# Patient Record
Sex: Male | Born: 1965 | Race: White | Hispanic: No | Marital: Single | Smoking: Never smoker
Health system: Southern US, Community
[De-identification: ages and names within clinical notes are randomized; demographics above are authoritative.]

## PROBLEM LIST (undated history)

## (undated) DIAGNOSIS — K219 Gastro-esophageal reflux disease without esophagitis: Secondary | ICD-10-CM

## (undated) DIAGNOSIS — G4733 Obstructive sleep apnea (adult) (pediatric): Secondary | ICD-10-CM

## (undated) DIAGNOSIS — E7800 Pure hypercholesterolemia, unspecified: Secondary | ICD-10-CM

## (undated) DIAGNOSIS — T7840XA Allergy, unspecified, initial encounter: Secondary | ICD-10-CM

## (undated) DIAGNOSIS — L309 Dermatitis, unspecified: Secondary | ICD-10-CM

## (undated) HISTORY — DX: Dermatitis, unspecified: L30.9

## (undated) HISTORY — DX: Allergy, unspecified, initial encounter: T78.40XA

## (undated) HISTORY — DX: Gastro-esophageal reflux disease without esophagitis: K21.9

---

## 1998-12-03 ENCOUNTER — Emergency Department (HOSPITAL_COMMUNITY): Admission: EM | Admit: 1998-12-03 | Discharge: 1998-12-03 | Payer: Self-pay | Admitting: Emergency Medicine

## 1998-12-03 ENCOUNTER — Encounter: Payer: Self-pay | Admitting: Emergency Medicine

## 2004-10-30 ENCOUNTER — Ambulatory Visit: Payer: Self-pay | Admitting: Family Medicine

## 2005-10-01 ENCOUNTER — Ambulatory Visit: Payer: Self-pay | Admitting: Family Medicine

## 2007-06-09 ENCOUNTER — Ambulatory Visit: Payer: Self-pay | Admitting: Family Medicine

## 2007-06-09 LAB — CONVERTED CEMR LAB
ALT: 74 units/L — ABNORMAL HIGH (ref 0–53)
AST: 34 units/L (ref 0–37)
Albumin: 4 g/dL (ref 3.5–5.2)
Alkaline Phosphatase: 70 units/L (ref 39–117)
BUN: 21 mg/dL (ref 6–23)
Basophils Absolute: 0 10*3/uL (ref 0.0–0.1)
Basophils Relative: 0.4 % (ref 0.0–1.0)
Bilirubin, Direct: 0.1 mg/dL (ref 0.0–0.3)
CO2: 32 meq/L (ref 19–32)
Calcium: 9.3 mg/dL (ref 8.4–10.5)
Chloride: 110 meq/L (ref 96–112)
Cholesterol: 218 mg/dL (ref 0–200)
Creatinine, Ser: 1 mg/dL (ref 0.4–1.5)
Direct LDL: 132.6 mg/dL
Eosinophils Absolute: 0.2 10*3/uL (ref 0.0–0.6)
Eosinophils Relative: 2 % (ref 0.0–5.0)
GFR calc Af Amer: 106 mL/min
GFR calc non Af Amer: 88 mL/min
Glucose, Bld: 104 mg/dL — ABNORMAL HIGH (ref 70–99)
HCT: 46.8 % (ref 39.0–52.0)
HDL: 31.6 mg/dL — ABNORMAL LOW (ref >39.0)
Hemoglobin: 15.8 g/dL (ref 13.0–17.0)
Lymphocytes Relative: 37.5 % (ref 12.0–46.0)
MCHC: 33.7 g/dL (ref 30.0–36.0)
MCV: 88.5 fL (ref 78.0–100.0)
Monocytes Absolute: 0.7 10*3/uL (ref 0.2–0.7)
Monocytes Relative: 8.2 % (ref 3.0–11.0)
Neutro Abs: 4.4 10*3/uL (ref 1.4–7.7)
Neutrophils Relative %: 51.9 % (ref 43.0–77.0)
Platelets: 257 10*3/uL (ref 150–400)
Potassium: 4.5 meq/L (ref 3.5–5.1)
RBC: 5.29 M/uL (ref 4.22–5.81)
RDW: 12.3 % (ref 11.5–14.6)
Sodium: 144 meq/L (ref 135–145)
TSH: 4.69 microintl units/mL (ref 0.35–5.50)
Total Bilirubin: 0.6 mg/dL (ref 0.3–1.2)
Total CHOL/HDL Ratio: 6.9
Total Protein: 6.8 g/dL (ref 6.0–8.3)
Triglycerides: 366 mg/dL (ref 0–149)
VLDL: 73 mg/dL — ABNORMAL HIGH (ref 0–40)
WBC: 8.5 10*3/uL (ref 4.5–10.5)

## 2007-06-16 ENCOUNTER — Ambulatory Visit: Payer: Self-pay | Admitting: Family Medicine

## 2007-06-16 LAB — CONVERTED CEMR LAB
ALT: 52 units/L (ref 0–53)
AST: 30 units/L (ref 0–37)
Albumin: 4.2 g/dL (ref 3.5–5.2)
Alkaline Phosphatase: 67 units/L (ref 39–117)
BUN: 11 mg/dL (ref 6–23)
Basophils Absolute: 0 10*3/uL (ref 0.0–0.1)
Basophils Relative: 0.4 % (ref 0.0–1.0)
Bilirubin, Direct: 0.1 mg/dL (ref 0.0–0.3)
CO2: 32 meq/L (ref 19–32)
Calcium: 9.3 mg/dL (ref 8.4–10.5)
Chloride: 101 meq/L (ref 96–112)
Cholesterol: 203 mg/dL (ref 0–200)
Creatinine, Ser: 1 mg/dL (ref 0.4–1.5)
Direct LDL: 144.9 mg/dL
Eosinophils Absolute: 0.1 10*3/uL (ref 0.0–0.6)
Eosinophils Relative: 0.6 % (ref 0.0–5.0)
GFR calc Af Amer: 106 mL/min
GFR calc non Af Amer: 88 mL/min
Glucose, Bld: 94 mg/dL (ref 70–99)
HCT: 45.4 % (ref 39.0–52.0)
HDL: 33.6 mg/dL — ABNORMAL LOW (ref >39.0)
Hemoglobin: 15.5 g/dL (ref 13.0–17.0)
Lymphocytes Relative: 19.7 % (ref 12.0–46.0)
MCHC: 34.1 g/dL (ref 30.0–36.0)
MCV: 89.2 fL (ref 78.0–100.0)
Monocytes Absolute: 0.7 10*3/uL (ref 0.2–0.7)
Monocytes Relative: 6.9 % (ref 3.0–11.0)
Neutro Abs: 7.6 10*3/uL (ref 1.4–7.7)
Neutrophils Relative %: 72.4 % (ref 43.0–77.0)
PSA: 0.66 ng/mL (ref 0.10–4.00)
Platelets: 325 10*3/uL (ref 150–400)
Potassium: 4 meq/L (ref 3.5–5.1)
RBC: 5.09 M/uL (ref 4.22–5.81)
RDW: 13.1 % (ref 11.5–14.6)
Sodium: 141 meq/L (ref 135–145)
TSH: 1.83 microintl units/mL (ref 0.35–5.50)
Total Bilirubin: 0.8 mg/dL (ref 0.3–1.2)
Total CHOL/HDL Ratio: 6
Total Protein: 7.4 g/dL (ref 6.0–8.3)
Triglycerides: 180 mg/dL — ABNORMAL HIGH (ref 0–149)
VLDL: 36 mg/dL (ref 0–40)
WBC: 10.4 10*3/uL (ref 4.5–10.5)

## 2008-02-23 ENCOUNTER — Ambulatory Visit: Payer: Self-pay | Admitting: Family Medicine

## 2008-02-23 DIAGNOSIS — F39 Unspecified mood [affective] disorder: Secondary | ICD-10-CM

## 2008-02-23 DIAGNOSIS — J309 Allergic rhinitis, unspecified: Secondary | ICD-10-CM | POA: Insufficient documentation

## 2008-02-23 DIAGNOSIS — L301 Dyshidrosis [pompholyx]: Secondary | ICD-10-CM

## 2008-07-12 ENCOUNTER — Ambulatory Visit: Payer: Self-pay | Admitting: Family Medicine

## 2008-07-12 LAB — CONVERTED CEMR LAB
ALT: 28 units/L (ref 0–53)
AST: 23 units/L (ref 0–37)
Albumin: 4 g/dL (ref 3.5–5.2)
Alkaline Phosphatase: 63 units/L (ref 39–117)
BUN: 17 mg/dL (ref 6–23)
Basophils Absolute: 0 10*3/uL (ref 0.0–0.1)
Basophils Relative: 0.6 % (ref 0.0–3.0)
Bilirubin, Direct: 0.1 mg/dL (ref 0.0–0.3)
CO2: 32 meq/L (ref 19–32)
Calcium: 9.1 mg/dL (ref 8.4–10.5)
Chloride: 106 meq/L (ref 96–112)
Cholesterol: 173 mg/dL (ref 0–200)
Creatinine, Ser: 1.1 mg/dL (ref 0.4–1.5)
Direct LDL: 108.7 mg/dL
Eosinophils Absolute: 0.1 10*3/uL (ref 0.0–0.7)
Eosinophils Relative: 1.3 % (ref 0.0–5.0)
GFR calc Af Amer: 95 mL/min
GFR calc non Af Amer: 78 mL/min
Glucose, Bld: 101 mg/dL — ABNORMAL HIGH (ref 70–99)
HCT: 43.7 % (ref 39.0–52.0)
HDL: 29 mg/dL — ABNORMAL LOW (ref >39.0)
Hemoglobin: 15.6 g/dL (ref 13.0–17.0)
Lymphocytes Relative: 28.2 % (ref 12.0–46.0)
MCHC: 35.7 g/dL (ref 30.0–36.0)
MCV: 89.6 fL (ref 78.0–100.0)
Monocytes Absolute: 0.7 10*3/uL (ref 0.1–1.0)
Monocytes Relative: 8.2 % (ref 3.0–12.0)
Neutro Abs: 4.9 10*3/uL (ref 1.4–7.7)
Neutrophils Relative %: 61.7 % (ref 43.0–77.0)
Platelets: 276 10*3/uL (ref 150–400)
Potassium: 4.3 meq/L (ref 3.5–5.1)
RBC: 4.88 M/uL (ref 4.22–5.81)
RDW: 12.2 % (ref 11.5–14.6)
Sodium: 143 meq/L (ref 135–145)
TSH: 2.36 microintl units/mL (ref 0.35–5.50)
Total Bilirubin: 0.8 mg/dL (ref 0.3–1.2)
Total CHOL/HDL Ratio: 6
Total Protein: 6.8 g/dL (ref 6.0–8.3)
Triglycerides: 206 mg/dL (ref 0–149)
VLDL: 41 mg/dL — ABNORMAL HIGH (ref 0–40)
WBC: 8 10*3/uL (ref 4.5–10.5)

## 2008-07-14 ENCOUNTER — Telehealth: Payer: Self-pay | Admitting: *Deleted

## 2008-07-19 ENCOUNTER — Ambulatory Visit: Payer: Self-pay | Admitting: Family Medicine

## 2008-07-19 DIAGNOSIS — K219 Gastro-esophageal reflux disease without esophagitis: Secondary | ICD-10-CM

## 2011-03-26 ENCOUNTER — Ambulatory Visit (INDEPENDENT_AMBULATORY_CARE_PROVIDER_SITE_OTHER): Payer: BC Managed Care – PPO | Admitting: Family Medicine

## 2011-03-26 ENCOUNTER — Encounter: Payer: Self-pay | Admitting: Family Medicine

## 2011-03-26 VITALS — BP 130/90 | Temp 98.2°F | Ht 73.0 in | Wt 252.0 lb

## 2011-03-26 DIAGNOSIS — J309 Allergic rhinitis, unspecified: Secondary | ICD-10-CM

## 2011-03-26 MED ORDER — PREDNISONE 20 MG PO TABS: ORAL_TABLET | ORAL | Status: DC

## 2011-03-26 NOTE — Patient Instructions (Signed)
Take the prednisone as directed.  Once u  begin tapering the prednisone to every other day, then restart your Zyrtec, 10 mg plain, nightly.  Return p.r.n.

## 2011-03-26 NOTE — Progress Notes (Signed)
  Subjective:    Patient ID: Adrian Powers, male    DOB: Jul 15, 1966, 45 y.o.   MRN: [id number]  Adrian Powers Is a 45 year old, married male, nonsmoker, who comes in today with a 3 week history of allergic rhinitis and two days of pain in his right ear.  He has a history of seasonal allergic rhinitis.  In [place] for about 3 to 4 weeks ago.  Over the weekend developed severe pain in his right ear.  No fever.     Review of Systems    General an infectious and endocrinology, and immunologic review of systems otherwise negative Objective:   Physical Exam    Well-developed well-nourished, male in no acute distress.  Examination HEENT negative.  Neck was supple.  No adenopathy.  Lungs are clear    Assessment & Plan:  Allergic rhinitis.  Plan,,,,,,,,,, prednisone burst and taper followed up by Zyrtec 10 mg nightly

## 2011-07-16 ENCOUNTER — Telehealth: Payer: Self-pay | Admitting: Family Medicine

## 2011-07-16 ENCOUNTER — Ambulatory Visit (INDEPENDENT_AMBULATORY_CARE_PROVIDER_SITE_OTHER): Payer: BC Managed Care – PPO | Admitting: Family Medicine

## 2011-07-16 ENCOUNTER — Encounter: Payer: Self-pay | Admitting: Family Medicine

## 2011-07-16 DIAGNOSIS — J309 Allergic rhinitis, unspecified: Secondary | ICD-10-CM

## 2011-07-16 DIAGNOSIS — R609 Edema, unspecified: Secondary | ICD-10-CM

## 2011-07-16 DIAGNOSIS — Z125 Encounter for screening for malignant neoplasm of prostate: Secondary | ICD-10-CM

## 2011-07-16 MED ORDER — TRIAMTERENE-HCTZ 37.5-25 MG PO TABS: 1.0000 | ORAL_TABLET | Freq: Every day | ORAL | Status: AC

## 2011-07-16 NOTE — Patient Instructions (Signed)
Stay on a complete no salt in diet.  Adequate thiazide 25 mg one tablet daily in the morning.  Elevate her legs as much as possible.  Begin plain Claritin in the morning or plain Zyrtec at bedtime.  ............. allergy symptoms.  Return in two weeks for follow-up

## 2011-07-16 NOTE — Progress Notes (Signed)
  Subjective:    Patient ID: Adrian Powers, male    DOB: December 28, 1965, 45 y.o.   MRN: [id number]  HPI Adrian Powers is a 45 year old, married male, nonsmoker, who comes in today for evaluation of two problems.  The past two weeks.  He says a condition.  Postnasal drip and cough.  He's had a history of allergic rhinitis in the past.  For the past two, months he's had intermittent swelling of his legs, left worse than the right.  His weight is up to 265 pounds.  No chest pain, shortness of breath, PND, or orthopnea   Review of Systems In general, allergic, and cardiovascular review of systems otherwise negative    Objective:   Physical Exam Well-developed overweight male in no acute distress.  Examination of the HEENT were negative, except a lot of postnasal drip, consistent with allergic rhinitis.  Neck was supple.  No adenopathy.  Lungs are clear.  Abdominal exam negative.  Cardiac exam negative.  The legs appear normal.  Left leg is 2+ and the right leg is 1+ edema.  Pulses normal       Assessment & Plan:  Allergic rhinitis.  Plan OTC Claritin or Zyrtec.  Peripheral edema.  Heart size 25 mg q.a.m. Follow-up in two weeks, no salt diet, caloric restriction for weight loss

## 2011-07-16 NOTE — Telephone Encounter (Signed)
Patient will be having cpx labs on 09-03-2011 and would like to have psa (prostate lab). Patient's father has a prostate history and Dr Tawanna Cooler had told him in the past that he will need to be tested. Please order. Thanks.

## 2011-07-30 ENCOUNTER — Ambulatory Visit: Payer: BC Managed Care – PPO | Admitting: Family Medicine

## 2011-09-03 ENCOUNTER — Other Ambulatory Visit: Payer: BC Managed Care – PPO

## 2011-09-10 ENCOUNTER — Encounter: Payer: BC Managed Care – PPO | Admitting: Family Medicine

## 2012-05-19 ENCOUNTER — Emergency Department (HOSPITAL_BASED_OUTPATIENT_CLINIC_OR_DEPARTMENT_OTHER): Payer: BC Managed Care – PPO

## 2012-05-19 ENCOUNTER — Ambulatory Visit (HOSPITAL_BASED_OUTPATIENT_CLINIC_OR_DEPARTMENT_OTHER)
Admission: EM | Admit: 2012-05-19 | Discharge: 2012-05-21 | DRG: 883 | Disposition: A | Discharge status: Home / Self Care | Payer: BC Managed Care – PPO | Attending: Surgery | Admitting: Surgery

## 2012-05-19 ENCOUNTER — Encounter (HOSPITAL_BASED_OUTPATIENT_CLINIC_OR_DEPARTMENT_OTHER): Payer: Self-pay | Admitting: Student

## 2012-05-19 DIAGNOSIS — K37 Unspecified appendicitis: Secondary | ICD-10-CM

## 2012-05-19 DIAGNOSIS — G4733 Obstructive sleep apnea (adult) (pediatric): Secondary | ICD-10-CM | POA: Insufficient documentation

## 2012-05-19 DIAGNOSIS — K3580 Unspecified acute appendicitis: Secondary | ICD-10-CM | POA: Insufficient documentation

## 2012-05-19 DIAGNOSIS — K219 Gastro-esophageal reflux disease without esophagitis: Secondary | ICD-10-CM | POA: Insufficient documentation

## 2012-05-19 DIAGNOSIS — G43909 Migraine, unspecified, not intractable, without status migrainosus: Secondary | ICD-10-CM | POA: Insufficient documentation

## 2012-05-19 DIAGNOSIS — R109 Unspecified abdominal pain: Secondary | ICD-10-CM

## 2012-05-19 DIAGNOSIS — E7800 Pure hypercholesterolemia, unspecified: Secondary | ICD-10-CM | POA: Insufficient documentation

## 2012-05-19 DIAGNOSIS — L259 Unspecified contact dermatitis, unspecified cause: Secondary | ICD-10-CM | POA: Insufficient documentation

## 2012-05-19 HISTORY — DX: Obstructive sleep apnea (adult) (pediatric): G47.33

## 2012-05-19 HISTORY — DX: Pure hypercholesterolemia, unspecified: E78.00

## 2012-05-19 LAB — CBC
MCV: 86.1 fL (ref 78.0–100.0)
Platelets: 273 10*3/uL (ref 150–400)
RDW: 12.9 % (ref 11.5–15.5)
WBC: 14.6 10*3/uL — ABNORMAL HIGH (ref 4.0–10.5)

## 2012-05-19 LAB — URINALYSIS, ROUTINE W REFLEX MICROSCOPIC
Leukocytes, UA: NEGATIVE
Nitrite: NEGATIVE
Protein, ur: NEGATIVE mg/dL
Urobilinogen, UA: 1 mg/dL (ref 0.0–1.0)

## 2012-05-19 LAB — COMPREHENSIVE METABOLIC PANEL
Albumin: 4 g/dL (ref 3.5–5.2)
Alkaline Phosphatase: 83 U/L (ref 39–117)
Creatinine, Ser: 1 mg/dL (ref 0.50–1.35)
GFR calc Af Amer: >90 mL/min (ref >90)
GFR calc non Af Amer: 89 mL/min — ABNORMAL LOW (ref >90)
Sodium: 136 mEq/L (ref 135–145)
Total Bilirubin: 0.6 mg/dL (ref 0.3–1.2)

## 2012-05-19 LAB — DIFFERENTIAL
Basophils Absolute: 0.1 10*3/uL (ref 0.0–0.1)
Eosinophils Absolute: 0.1 10*3/uL (ref 0.0–0.7)
Eosinophils Relative: 1 % (ref 0–5)
Lymphocytes Relative: 18 % (ref 12–46)
Neutrophils Relative %: 69 % (ref 43–77)

## 2012-05-19 MED ORDER — SODIUM CHLORIDE 0.9 % IV BOLUS (SEPSIS)
1000.0000 mL | Freq: Once | INTRAVENOUS | Status: AC
  Administered 2012-05-20: 1000 mL via INTRAVENOUS

## 2012-05-19 MED ORDER — ONDANSETRON HCL 4 MG/2ML IJ SOLN
4.0000 mg | Freq: Once | INTRAMUSCULAR | Status: AC
  Administered 2012-05-19: 4 mg via INTRAVENOUS
  Filled 2012-05-19: qty 2

## 2012-05-19 MED ORDER — MORPHINE SULFATE 2 MG/ML IJ SOLN
INTRAMUSCULAR | Status: AC
  Administered 2012-05-20: 2 mg via INTRAVENOUS
  Filled 2012-05-19: qty 1

## 2012-05-19 MED ORDER — IOHEXOL 300 MG/ML  SOLN
20.0000 mL | INTRAMUSCULAR | Status: AC
  Administered 2012-05-19 – 2012-05-20 (×2): 20 mL via ORAL

## 2012-05-19 MED ORDER — MORPHINE SULFATE 4 MG/ML IJ SOLN
6.0000 mg | Freq: Once | INTRAMUSCULAR | Status: AC
  Administered 2012-05-19: 4 mg via INTRAVENOUS
  Filled 2012-05-19: qty 1

## 2012-05-19 NOTE — ED Provider Notes (Addendum)
History     CSN: [id number]  Arrival date & time 05/19/12  2051   First MD Initiated Contact with Patient 05/19/12 2233      Chief Complaint  Patient presents with  . Abdominal Pain    RLQ     (Consider location/radiation/quality/duration/timing/severity/associated sxs/prior treatment) HPI Comments: Pt with gradually worsening pain to lower abd since yesterday.  Started across both sides of lower abd, no only in RLQ.  Has had some nausea, no vomiting.  +subjective fevers.  No urinary difficulties.  Has had some loose stools today.  No hx of abd surgery.  Patient is a 46 y.o. male presenting with abdominal pain. The history is provided by the patient.  Abdominal Pain The primary symptoms of the illness include abdominal pain, fever and nausea. The primary symptoms of the illness do not include fatigue, shortness of breath, vomiting or diarrhea. The current episode started yesterday. The onset of the illness was gradual. The problem has been gradually worsening.  Symptoms associated with the illness do not include chills, diaphoresis, hematuria, frequency or back pain.    Past Medical History  Diagnosis Date  . Migraine   . Eczema   . Allergy   . GERD (gastroesophageal reflux disease)   . Hypercholesteremia     Past Surgical History  Procedure Date  . Esophagogastroduodenoscopy   . Colonoscopy 03/07/01    Family History  Problem Relation Age of Onset  . Cancer Other     prostate    History  Substance Use Topics  . Smoking status: Never Smoker   . Smokeless tobacco: Not on file  . Alcohol Use: No      Review of Systems  Constitutional: Positive for fever. Negative for chills, diaphoresis and fatigue.  HENT: Negative for congestion, rhinorrhea and sneezing.   Eyes: Negative.   Respiratory: Negative for cough, chest tightness and shortness of breath.   Cardiovascular: Negative for chest pain and leg swelling.  Gastrointestinal: Positive for nausea and abdominal  pain. Negative for vomiting, diarrhea and blood in stool.  Genitourinary: Negative for frequency, hematuria, flank pain, difficulty urinating and testicular pain.  Musculoskeletal: Negative for back pain and arthralgias.  Skin: Negative for rash.  Neurological: Negative for dizziness, speech difficulty, weakness, numbness and headaches.    Allergies  Review of patient's allergies indicates no known allergies.  Home Medications   Current Outpatient Rx  Name Route Sig Dispense Refill  . ATORVASTATIN CALCIUM 40 MG PO TABS Oral Take 40 mg by mouth daily.    . IBUPROFEN 200 MG PO TABS Oral Take 400 mg by mouth every 6 (six) hours as needed. Patient used this medication for pain.    . TRIAMTERENE-HCTZ 37.5-25 MG PO TABS Oral Take 1 tablet by mouth daily. 100 tablet 3    BP 134/77  Pulse 90  Temp 100.1 F (37.8 C) (Oral)  Resp 18  Ht 6\' 1"  (1.854 m)  Wt 247 lb (112.038 kg)  BMI 32.59 kg/m2  SpO2 97%  Physical Exam  Constitutional: He is oriented to person, place, and time. He appears well-developed and well-nourished.  HENT:  Head: Normocephalic and atraumatic.  Eyes: Pupils are equal, round, and reactive to light.  Neck: Normal range of motion. Neck supple.  Cardiovascular: Normal rate, regular rhythm and normal heart sounds.   Pulmonary/Chest: Effort normal and breath sounds normal. No respiratory distress. He has no wheezes. He has no rales. He exhibits no tenderness.  Abdominal: Soft. Bowel sounds are normal. There is  tenderness in the right lower quadrant. There is guarding and tenderness at McBurney's point. There is no rebound and no CVA tenderness.  Musculoskeletal: Normal range of motion. He exhibits no edema.  Lymphadenopathy:    He has no cervical adenopathy.  Neurological: He is alert and oriented to person, place, and time.  Skin: Skin is warm and dry. No rash noted.  Psychiatric: He has a normal mood and affect.    ED Course  Procedures (including critical care  time)  Results for orders placed during the hospital encounter of 05/19/12  URINALYSIS, ROUTINE W REFLEX MICROSCOPIC      Component Value Range   Color, Urine YELLOW  YELLOW   APPearance CLEAR  CLEAR   Specific Gravity, Urine 1.025  1.005 - 1.030   pH 6.0  5.0 - 8.0   Glucose, UA NEGATIVE  NEGATIVE mg/dL   Hgb urine dipstick NEGATIVE  NEGATIVE   Bilirubin Urine NEGATIVE  NEGATIVE   Ketones, ur NEGATIVE  NEGATIVE mg/dL   Protein, ur NEGATIVE  NEGATIVE mg/dL   Urobilinogen, UA 1.0  0.0 - 1.0 mg/dL   Nitrite NEGATIVE  NEGATIVE   Leukocytes, UA NEGATIVE  NEGATIVE  COMPREHENSIVE METABOLIC PANEL      Component Value Range   Sodium 136  135 - 145 mEq/L   Potassium 3.5  3.5 - 5.1 mEq/L   Chloride 96  96 - 112 mEq/L   CO2 31  19 - 32 mEq/L   Glucose, Bld 110 (*) 70 - 99 mg/dL   BUN 17  6 - 23 mg/dL   Creatinine, Ser 1.61  0.50 - 1.35 mg/dL   Calcium 9.5  8.4 - 09.6 mg/dL   Total Protein 7.7  6.0 - 8.3 g/dL   Albumin 4.0  3.5 - 5.2 g/dL   AST 17  0 - 37 U/L   ALT 29  0 - 53 U/L   Alkaline Phosphatase 83  39 - 117 U/L   Total Bilirubin 0.6  0.3 - 1.2 mg/dL   GFR calc non Af Amer 89 (*) >90 mL/min   GFR calc Af Amer >90  >90 mL/min  CBC      Component Value Range   WBC 14.6 (*) 4.0 - 10.5 K/uL   RBC 4.96  4.22 - 5.81 MIL/uL   Hemoglobin 15.3  13.0 - 17.0 g/dL   HCT 04.5  40.9 - 81.1 %   MCV 86.1  78.0 - 100.0 fL   MCH 30.8  26.0 - 34.0 pg   MCHC 35.8  30.0 - 36.0 g/dL   RDW 91.4  78.2 - 95.6 %   Platelets 273  150 - 400 K/uL  DIFFERENTIAL      Component Value Range   Neutrophils Relative 69  43 - 77 %   Neutro Abs 10.1 (*) 1.7 - 7.7 K/uL   Lymphocytes Relative 18  12 - 46 %   Lymphs Abs 2.7  0.7 - 4.0 K/uL   Monocytes Relative 12  3 - 12 %   Monocytes Absolute 1.7 (*) 0.1 - 1.0 K/uL   Eosinophils Relative 1  0 - 5 %   Eosinophils Absolute 0.1  0.0 - 0.7 K/uL   Basophils Relative 0  0 - 1 %   Basophils Absolute 0.1  0.0 - 0.1 K/uL   No results found.  Date: 05/20/2012   Rate: 73  Rhythm: sinus rhythm   QRS Axis: normal  Intervals: normal  ST/T Wave abnormalities:normal  Conduction Disutrbances:none  Narrative Interpretation:   Old EKG Reviewed: similar to prior EKG     1. Abdominal pain       MDM  Pt awaiting CT abd/pelvis.  Concern for appendicitis.  Given pain meds, IVFs.  Will turn over to Dr. Nicanor Alcon pending CT scan        Rolan Bucco, MD 05/19/12 1610  Rolan Bucco, MD 08/30/12 [phone]

## 2012-05-19 NOTE — ED Notes (Signed)
Pt in with c/o lower abd pain with onset yesterday across entire lower abd and reports feeling need to defecate with onset but no relief. Reports pain only in lower r quad at present time. Denies N V D CP LOC at present time but reports N with onset yesterday. Denies urinary difficulties.

## 2012-05-20 ENCOUNTER — Encounter (HOSPITAL_COMMUNITY): Payer: Self-pay | Admitting: Anesthesiology

## 2012-05-20 ENCOUNTER — Emergency Department (HOSPITAL_BASED_OUTPATIENT_CLINIC_OR_DEPARTMENT_OTHER): Payer: BC Managed Care – PPO

## 2012-05-20 ENCOUNTER — Encounter (HOSPITAL_COMMUNITY): Payer: Self-pay | Admitting: *Deleted

## 2012-05-20 ENCOUNTER — Emergency Department (HOSPITAL_COMMUNITY): Payer: BC Managed Care – PPO | Admitting: Anesthesiology

## 2012-05-20 ENCOUNTER — Encounter (HOSPITAL_COMMUNITY): Payer: Self-pay | Admitting: General Surgery

## 2012-05-20 ENCOUNTER — Encounter (HOSPITAL_COMMUNITY)
Admission: EM | Disposition: A | Discharge status: Home / Self Care | Payer: Self-pay | Source: Home / Self Care | Attending: Emergency Medicine

## 2012-05-20 DIAGNOSIS — K3580 Unspecified acute appendicitis: Secondary | ICD-10-CM

## 2012-05-20 HISTORY — PX: LAPAROSCOPIC APPENDECTOMY: SHX408

## 2012-05-20 SURGERY — APPENDECTOMY, LAPAROSCOPIC
Anesthesia: General | Site: Abdomen | Wound class: Contaminated

## 2012-05-20 MED ORDER — GLYCOPYRROLATE 0.2 MG/ML IJ SOLN
INTRAMUSCULAR | Status: DC | PRN
  Administered 2012-05-20: .6 mg via INTRAVENOUS
  Administered 2012-05-20: .2 mg via INTRAVENOUS

## 2012-05-20 MED ORDER — ONDANSETRON HCL 4 MG PO TABS: 4.0000 mg | ORAL_TABLET | Freq: Four times a day (QID) | ORAL | Status: DC | PRN

## 2012-05-20 MED ORDER — LIDOCAINE HCL (CARDIAC) 20 MG/ML IV SOLN
INTRAVENOUS | Status: DC | PRN
  Administered 2012-05-20: 100 mg via INTRAVENOUS

## 2012-05-20 MED ORDER — ONDANSETRON HCL 4 MG/2ML IJ SOLN
INTRAMUSCULAR | Status: AC
  Filled 2012-05-20: qty 2

## 2012-05-20 MED ORDER — BUPIVACAINE HCL (PF) 0.5 % IJ SOLN
INTRAMUSCULAR | Status: AC
  Filled 2012-05-20: qty 30

## 2012-05-20 MED ORDER — LACTATED RINGERS IV SOLN
INTRAVENOUS | Status: DC | PRN
  Administered 2012-05-20 (×2): via INTRAVENOUS

## 2012-05-20 MED ORDER — KETOROLAC TROMETHAMINE 30 MG/ML IJ SOLN
INTRAMUSCULAR | Status: AC
  Filled 2012-05-20: qty 1

## 2012-05-20 MED ORDER — ACETAMINOPHEN 10 MG/ML IV SOLN
INTRAVENOUS | Status: AC
  Filled 2012-05-20: qty 100

## 2012-05-20 MED ORDER — HYDROMORPHONE HCL PF 1 MG/ML IJ SOLN: 0.2500 mg | INTRAMUSCULAR | Status: DC | PRN

## 2012-05-20 MED ORDER — OXYCODONE-ACETAMINOPHEN 5-325 MG PO TABS
1.0000 | ORAL_TABLET | ORAL | Status: DC | PRN
  Administered 2012-05-20 (×2): 2 via ORAL
  Administered 2012-05-21: 1 via ORAL
  Administered 2012-05-21: 2 via ORAL
  Administered 2012-05-21: 1 via ORAL
  Filled 2012-05-20 (×3): qty 2
  Filled 2012-05-20 (×2): qty 1

## 2012-05-20 MED ORDER — SODIUM CHLORIDE 0.9 % IV SOLN
3.0000 g | Freq: Four times a day (QID) | INTRAVENOUS | Status: DC
  Administered 2012-05-20 – 2012-05-21 (×6): 3 g via INTRAVENOUS
  Filled 2012-05-20 (×8): qty 3

## 2012-05-20 MED ORDER — SODIUM CHLORIDE 0.9 % IV SOLN
3.0000 g | Freq: Once | INTRAVENOUS | Status: AC
  Administered 2012-05-20: 3 g via INTRAVENOUS
  Filled 2012-05-20: qty 3

## 2012-05-20 MED ORDER — MORPHINE SULFATE 2 MG/ML IJ SOLN
2.0000 mg | Freq: Once | INTRAMUSCULAR | Status: AC
  Administered 2012-05-20: 2 mg via INTRAVENOUS

## 2012-05-20 MED ORDER — DEXAMETHASONE SODIUM PHOSPHATE 4 MG/ML IJ SOLN
INTRAMUSCULAR | Status: DC | PRN
  Administered 2012-05-20: 10 mg via INTRAVENOUS

## 2012-05-20 MED ORDER — KETOROLAC TROMETHAMINE 15 MG/ML IJ SOLN
INTRAMUSCULAR | Status: AC
  Filled 2012-05-20: qty 1

## 2012-05-20 MED ORDER — NEOSTIGMINE METHYLSULFATE 1 MG/ML IJ SOLN
INTRAMUSCULAR | Status: DC | PRN
  Administered 2012-05-20: 4 mg via INTRAVENOUS

## 2012-05-20 MED ORDER — LACTATED RINGERS IR SOLN
Status: DC | PRN
  Administered 2012-05-20: 1000 mL

## 2012-05-20 MED ORDER — FENTANYL CITRATE 0.05 MG/ML IJ SOLN
INTRAMUSCULAR | Status: DC | PRN
  Administered 2012-05-20 (×2): 50 ug via INTRAVENOUS
  Administered 2012-05-20: 100 ug via INTRAVENOUS

## 2012-05-20 MED ORDER — MORPHINE SULFATE 2 MG/ML IJ SOLN
INTRAMUSCULAR | Status: AC
  Administered 2012-05-20: 2 mg via INTRAVENOUS
  Filled 2012-05-20: qty 1

## 2012-05-20 MED ORDER — PROMETHAZINE HCL 25 MG/ML IJ SOLN
6.2500 mg | INTRAMUSCULAR | Status: DC | PRN
  Administered 2012-05-20: 12.5 mg via INTRAVENOUS

## 2012-05-20 MED ORDER — PROPOFOL 10 MG/ML IV EMUL
INTRAVENOUS | Status: DC | PRN
  Administered 2012-05-20: 200 mg via INTRAVENOUS

## 2012-05-20 MED ORDER — SUCCINYLCHOLINE CHLORIDE 20 MG/ML IJ SOLN
INTRAMUSCULAR | Status: DC | PRN
  Administered 2012-05-20: 100 mg via INTRAVENOUS

## 2012-05-20 MED ORDER — KETOROLAC TROMETHAMINE 30 MG/ML IJ SOLN
15.0000 mg | Freq: Once | INTRAMUSCULAR | Status: AC | PRN
  Administered 2012-05-20: 30 mg via INTRAVENOUS

## 2012-05-20 MED ORDER — CISATRACURIUM BESYLATE (PF) 10 MG/5ML IV SOLN
INTRAVENOUS | Status: DC | PRN
  Administered 2012-05-20: 6 mg via INTRAVENOUS
  Administered 2012-05-20: 2 mg via INTRAVENOUS

## 2012-05-20 MED ORDER — ACETAMINOPHEN 10 MG/ML IV SOLN
INTRAVENOUS | Status: DC | PRN
  Administered 2012-05-20: 1000 mg via INTRAVENOUS

## 2012-05-20 MED ORDER — ONDANSETRON HCL 4 MG/2ML IJ SOLN: 4.0000 mg | Freq: Four times a day (QID) | INTRAMUSCULAR | Status: DC | PRN

## 2012-05-20 MED ORDER — PROMETHAZINE HCL 25 MG/ML IJ SOLN
INTRAMUSCULAR | Status: AC
  Filled 2012-05-20: qty 1

## 2012-05-20 MED ORDER — MORPHINE SULFATE 2 MG/ML IJ SOLN: 2.0000 mg | INTRAMUSCULAR | Status: DC | PRN

## 2012-05-20 MED ORDER — BUPIVACAINE HCL (PF) 0.5 % IJ SOLN
INTRAMUSCULAR | Status: DC | PRN
  Administered 2012-05-20: 14 mL

## 2012-05-20 MED ORDER — BUPIVACAINE-EPINEPHRINE (PF) 0.5% -1:200000 IJ SOLN
INTRAMUSCULAR | Status: AC
  Filled 2012-05-20: qty 10

## 2012-05-20 MED ORDER — KCL IN DEXTROSE-NACL 20-5-0.9 MEQ/L-%-% IV SOLN
INTRAVENOUS | Status: DC
  Administered 2012-05-20 – 2012-05-21 (×2): via INTRAVENOUS
  Filled 2012-05-20 (×5): qty 1000

## 2012-05-20 MED ORDER — ONDANSETRON HCL 4 MG/2ML IJ SOLN
INTRAMUSCULAR | Status: DC | PRN
  Administered 2012-05-20: 4 mg via INTRAVENOUS

## 2012-05-20 MED ORDER — IOHEXOL 300 MG/ML  SOLN
100.0000 mL | Freq: Once | INTRAMUSCULAR | Status: AC | PRN
  Administered 2012-05-20: 100 mL via INTRAVENOUS

## 2012-05-20 SURGICAL SUPPLY — 46 items
APL SKNCLS STERI-STRIP NONHPOA (GAUZE/BANDAGES/DRESSINGS) ×1
APPLIER CLIP 5 13 M/L LIGAMAX5 (MISCELLANEOUS) ×2
APPLIER CLIP ROT 10 11.4 M/L (STAPLE)
APR CLP MED LRG 11.4X10 (STAPLE)
APR CLP MED LRG 5 ANG JAW (MISCELLANEOUS) ×1
BAG SPEC RTRVL LRG 6X4 10 (ENDOMECHANICALS)
BENZOIN TINCTURE PRP APPL 2/3 (GAUZE/BANDAGES/DRESSINGS) ×2 IMPLANT
CANISTER SUCTION 2500CC (MISCELLANEOUS) ×2 IMPLANT
CHLORAPREP W/TINT 26ML (MISCELLANEOUS) ×2 IMPLANT
CLIP APPLIE 5 13 M/L LIGAMAX5 (MISCELLANEOUS) IMPLANT
CLIP APPLIE ROT 10 11.4 M/L (STAPLE) IMPLANT
CLOTH BEACON ORANGE TIMEOUT ST (SAFETY) ×2 IMPLANT
CLSR STERI-STRIP ANTIMIC 1/2X4 (GAUZE/BANDAGES/DRESSINGS) ×1 IMPLANT
CUTTER FLEX LINEAR 45M (STAPLE) ×1 IMPLANT
DECANTER SPIKE VIAL GLASS SM (MISCELLANEOUS) ×1 IMPLANT
DRAPE LAPAROSCOPIC ABDOMINAL (DRAPES) ×2 IMPLANT
DRAPE UTILITY XL STRL (DRAPES) ×2 IMPLANT
DRSG TEGADERM 2-3/8X2-3/4 SM (GAUZE/BANDAGES/DRESSINGS) ×4 IMPLANT
ELECT REM PT RETURN 9FT ADLT (ELECTROSURGICAL) ×2
ELECTRODE REM PT RTRN 9FT ADLT (ELECTROSURGICAL) ×1 IMPLANT
ENDOLOOP SUT PDS II  0 18 (SUTURE)
ENDOLOOP SUT PDS II 0 18 (SUTURE) IMPLANT
GLOVE BIOGEL PI IND STRL 7.0 (GLOVE) ×1 IMPLANT
GLOVE BIOGEL PI INDICATOR 7.0 (GLOVE) ×1
GLOVE ECLIPSE 8.0 STRL XLNG CF (GLOVE) ×2 IMPLANT
GLOVE INDICATOR 8.0 STRL GRN (GLOVE) ×4 IMPLANT
GOWN STRL NON-REIN LRG LVL3 (GOWN DISPOSABLE) ×2 IMPLANT
GOWN STRL REIN XL XLG (GOWN DISPOSABLE) ×4 IMPLANT
KIT BASIN OR (CUSTOM PROCEDURE TRAY) ×2 IMPLANT
PENCIL BUTTON HOLSTER BLD 10FT (ELECTRODE) ×1 IMPLANT
POUCH SPECIMEN RETRIEVAL 10MM (ENDOMECHANICALS) IMPLANT
RELOAD 45 VASCULAR/THIN (ENDOMECHANICALS) IMPLANT
RELOAD STAPLE 45 2.5 WHT GRN (ENDOMECHANICALS) IMPLANT
RELOAD STAPLE 45 3.5 BLU ETS (ENDOMECHANICALS) IMPLANT
RELOAD STAPLE TA45 3.5 REG BLU (ENDOMECHANICALS) ×2 IMPLANT
SCALPEL HARMONIC ACE (MISCELLANEOUS) IMPLANT
SET IRRIG TUBING LAPAROSCOPIC (IRRIGATION / IRRIGATOR) ×2 IMPLANT
SOLUTION ANTI FOG 6CC (MISCELLANEOUS) ×2 IMPLANT
STRIP CLOSURE SKIN 1/2X4 (GAUZE/BANDAGES/DRESSINGS) ×2 IMPLANT
SUT MNCRL AB 4-0 PS2 18 (SUTURE) ×2 IMPLANT
TOWEL OR 17X26 10 PK STRL BLUE (TOWEL DISPOSABLE) ×2 IMPLANT
TRAY FOLEY CATH 14FRSI W/METER (CATHETERS) ×2 IMPLANT
TRAY LAP CHOLE (CUSTOM PROCEDURE TRAY) ×2 IMPLANT
TROCAR BLADELESS OPT 5 75 (ENDOMECHANICALS) ×4 IMPLANT
TROCAR XCEL BLUNT TIP 100MML (ENDOMECHANICALS) IMPLANT
TUBING INSUFFLATION 10FT LAP (TUBING) ×2 IMPLANT

## 2012-05-20 NOTE — ED Notes (Signed)
Report given to [facility] for transport 

## 2012-05-20 NOTE — Anesthesia Postprocedure Evaluation (Signed)
  Anesthesia Post-op Note  Patient: Adrian Powers  Procedure(s) Performed: Procedure(s) (LRB): APPENDECTOMY LAPAROSCOPIC (N/A)  Patient Location: PACU  Anesthesia Type: General  Level of Consciousness: awake and alert   Airway and Oxygen Therapy: Patient Spontanous Breathing  Post-op Pain: mild  Post-op Assessment: Post-op Vital signs reviewed, Patient's Cardiovascular Status Stable, Respiratory Function Stable, Patent Airway and No signs of Nausea or vomiting  Post-op Vital Signs: stable  Complications: No apparent anesthesia complications

## 2012-05-20 NOTE — Progress Notes (Signed)
2lpm by o2 added to pt cpap unit

## 2012-05-20 NOTE — Progress Notes (Signed)
Pt placed at default settings for unk home settings.

## 2012-05-20 NOTE — Progress Notes (Signed)
Day of Surgery  Subjective: OK so far after surgery.  Up to BR once so far.  Objective: Vital signs in last 24 hours: Temp:  [97.4 F (36.3 C)-100.1 F (37.8 C)] 97.5 F (36.4 C) (06/18 1045) Pulse Rate:  [71-90] 74  (06/18 1045) Resp:  [10-18] 12  (06/18 1045) BP: (107-134)/(64-87) 107/69 mmHg (06/18 1045) SpO2:  [94 %-100 %] 94 % (06/18 1045) Weight:  [112.038 kg (247 lb)-112.401 kg (247 lb 12.8 oz)] 112.401 kg (247 lb 12.8 oz) (06/18 1145) Last BM Date: 05/19/12  Intake/Output from previous day: 06/17 0701 - 06/18 0700 In: 1300 [I.V.:1300] Out: 300 [Urine:200; Blood:100] Intake/Output this shift: Total I/O In: 1630.8 [P.O.:360; I.V.:1070.8; IV Piggyback:200] Out: 1100 [Urine:1100]  General appearance: alert, cooperative and no distress GI: tender, and sore.  Lab Results:   [facility] 05/19/12 2120  WBC 14.6*  HGB 15.3  HCT 42.7  PLT 273    BMET  Basename 05/19/12 2120  NA 136  K 3.5  CL 96  CO2 31  GLUCOSE 110*  BUN 17  CREATININE 1.00  CALCIUM 9.5   PT/INR No results found for this basename: LABPROT:2,INR:2 in the last 72 hours   Lab 05/19/12 2120  AST 17  ALT 29  ALKPHOS 83  BILITOT 0.6  PROT 7.7  ALBUMIN 4.0     Lipase  No results found for this basename: lipase     Studies/Results: Dg Chest 2 View  05/20/2012  *RADIOLOGY REPORT*  Clinical Data: Right lower quadrant abdominal pain.  Transfer admission chest radiograph.  CHEST - 2 VIEW  Comparison: None.  Findings: The lungs are well-aerated and clear.  There is no evidence of focal opacification, pleural effusion or pneumothorax.  The heart is normal in size; the mediastinal contour is within normal limits.  No acute osseous abnormalities are seen.  IMPRESSION: No acute cardiopulmonary process seen.  Original Report Authenticated By: Tonia Ghent, M.D.   Ct Abdomen Pelvis W Contrast  05/20/2012  *RADIOLOGY REPORT*  Clinical Data: Right lower quadrant pain.  Leukocytosis.  CT ABDOMEN AND  PELVIS WITH CONTRAST  Technique:  Multidetector CT imaging of the abdomen and pelvis was performed following the standard protocol during bolus administration of intravenous contrast.  Contrast: OMNIPAQUE IOHEXOL 300 MG/ML  SOLN  Comparison: None.  Findings: The abdominal parenchymal organs are normal in appearance.  Gallbladder is unremarkable.  No evidence of hydronephrosis.  No soft tissue masses or lymphadenopathy identified within the abdomen or pelvis.  The appendix is diffusely thickened and there is moderate periappendiceal inflammatory changes.  This is consistent with acute appendicitis.  No evidence of abscess or free fluid.  No evidence of bowel obstruction.  IMPRESSION:  1.  Positive for acute appendicitis. 2.  No evidence of abscess or other complication.  Original Report Authenticated By: Danae Orleans, M.D.    Medications:    . ampicillin-sulbactam (UNASYN) IV  3 g Intravenous Once  . ampicillin-sulbactam (UNASYN) IV  3 g Intravenous Q6H  . iohexol  20 mL Oral Q1 Hr x 2  . ketorolac      . ketorolac      .  morphine injection  2 mg Intravenous Once  . morphine      .  morphine injection  6 mg Intravenous Once  . ondansetron  4 mg Intravenous Once  . promethazine      . sodium chloride  1,000 mL Intravenous Once    Assessment/Plan Acute appendicitis without mention of peritonitis .  Migraine    .  Eczema    .  Allergy    .  GERD (gastroesophageal reflux disease)    .  Hypercholesteremia    .  OSA (obstructive sleep apnea)       Plan:  Advance to fulls as tolerated, mobilize.  IS, Will see in AM.     LOS: 1 day    Adrian Powers 05/20/2012

## 2012-05-20 NOTE — Op Note (Signed)
Appendectomy, Lap, Procedure Note  Indications: Mr. Levinson presented with a history of right-sided abdominal pain. His evaluation  revealed findings consistent with acute appendicitis.  He is now brought to the operating room for appendectomy.  Pre-operative Diagnosis: Acute appendicitis without mention of peritonitis  Post-operative Diagnosis: Same  Surgeon:  Avel Peace, M.D.  Anesthesia:  General   Anesthesia: General endotracheal anesthesia  ASA Class: 2  Procedure Details  He was seen again in the Holding Room.  The patient was taken to Operating Room, identified as DAMARRI RAMPY and the procedure verified as Appendectomy. A Time Out was held and the above information confirmed.  The patient was placed in the supine position and general anesthesia was induced, along with placement of orogastric tube, SCDs, and a Foley catheter.  The hair on the abdominal wall was clipped. The abdomen was prepped and draped in a sterile fashion. Dilute Marcaine solution was infiltrated at all trocar sites for local anesthetic effect.  A small infraumbilical incision was made through the skin, subcutaneous tissue, fascia, and peritoneum entering the peritoneal cavity under direct vision.  A pursestring suture was passed around the incision with a 0 Vicryl.  The Hasson was introduced into the peritoneal cavity and the tails of the suture were used to hold the Hasson in place.   The pneumoperitoneum was then established to steady pressure of 15 mmHg.  Additional 5 mm cannulas then placed in the left lower quadrant of the abdomen and the right upper quadrant region under direct visualization. A careful evaluation of the entire abdomen was carried out. The patient was placed in Trendelenburg and left lateral decubitus position. The small intestines were retracted in the cephalad and left lateral direction away from the pelvis and right lower quadrant. The patient was found to have an enlarged and  inflamed appendix that was in the right lower quadrant. Significant inflammatory changes were noted. There were inflammatory adhesions between the appendix and part of the sigmoid colon and terminal ileum.  There was no evidence of perforation or abscess.  The appendix was carefully mobilized by separating the appendix from the inflammatory adhesions to the sigmoid colon and terminal ileum bluntly.. The mesoappendix was  divided with the harmonic scalpel down to the base of the appendix.   The appendix was amputated off the cecum, with a small cuff of cecum, using an endo-GIA stapler.  There was a small amount of bleeding from the staple line which was controlled with hemoclips, but there was no evidence of leakage. Copious irrigation was  performed and irrigant fluid suctioned from the abdomen as much as possible.  A 4 quadrant inspection was performed and there is no evidence of bleeding or organ injury.  The umbilical trocar was removed and the  port site fascia was closed via the purse string suture under laparoscopic vision. There was no residual palpable fascial defect.  The remaining trocars were removed and all  trocar site skin wounds were closed with 4-0 Monocryl subcuticular stitches. Steri-Strips and sterile dressings were applied.  Instrument, sponge, and needle counts were correct at the conclusion of the case.   Findings: The appendix was found to be inflamed. There were not signs of necrosis.  There was not perforation. There was not abscess formation.  Estimated Blood Loss:  less than 100 mL         Drains: None                 Specimens: Appendix  Complications:  None; patient tolerated the procedure well.         Disposition: PACU - hemodynamically stable.         Condition: stable

## 2012-05-20 NOTE — ED Notes (Signed)
Report given to charge RN Diane at [facility]

## 2012-05-20 NOTE — Transfer of Care (Signed)
Immediate Anesthesia Transfer of Care Note  Patient: Adrian Powers  Procedure(s) Performed: Procedure(s) (LRB): APPENDECTOMY LAPAROSCOPIC (N/A)  Patient Location: PACU  Anesthesia Type: General  Level of Consciousness: sedated  Airway & Oxygen Therapy: Patient Spontanous Breathing and Patient connected to face mask oxygen  Post-op Assessment: Report given to PACU RN and Post -op Vital signs reviewed and stable  Post vital signs: Reviewed and stable  Complications: No apparent anesthesia complications

## 2012-05-20 NOTE — Anesthesia Preprocedure Evaluation (Addendum)
Anesthesia Evaluation  Patient identified by MRN, date of birth, ID band Patient awake    Reviewed: Allergy & Precautions, H&P , NPO status , Patient's Chart, lab work & pertinent test results  Airway Mallampati: II TM Distance: <3 FB Neck ROM: Full    Dental No notable dental hx.    Pulmonary sleep apnea ,  breath sounds clear to auscultation  Pulmonary exam normal       Cardiovascular negative cardio ROS  Rhythm:Regular Rate:Normal     Neuro/Psych negative neurological ROS  negative psych ROS   GI/Hepatic negative GI ROS, Neg liver ROS,   Endo/Other  negative endocrine ROS  Renal/GU negative Renal ROS  negative genitourinary   Musculoskeletal negative musculoskeletal ROS (+)   Abdominal   Peds negative pediatric ROS (+)  Hematology negative hematology ROS (+)   Anesthesia Other Findings   Reproductive/Obstetrics negative OB ROS                           Anesthesia Physical Anesthesia Plan  ASA: II and Emergent  Anesthesia Plan: General   Post-op Pain Management:    Induction: Intravenous and Rapid sequence  Airway Management Planned: Oral ETT  Additional Equipment:   Intra-op Plan:   Post-operative Plan: Extubation in OR  Informed Consent: I have reviewed the patients History and Physical, chart, labs and discussed the procedure including the risks, benefits and alternatives for the proposed anesthesia with the patient or authorized representative who has indicated his/her understanding and acceptance.   Dental advisory given  Plan Discussed with: CRNA  Anesthesia Plan Comments:         Anesthesia Quick Evaluation

## 2012-05-20 NOTE — H&P (Signed)
Adrian Powers is an 46 y.o. male.   Chief Complaint: Acute appendicitis HPI: He was transferred from the Medical [facility] in [facility] because of acute appendicitis. Yesterday at noon he developed some sharp lower abdominal pain that persisted. He had subjective fever and chills as well as nausea. The pain eventually radiated to the right lower quadrant. He was evaluated at the Medical Center and found to have acute appendicitis by clinical exam and CT scan. He is here now for appendectomy.  Past Medical History  Diagnosis Date  . Migraine   . Eczema   . Allergy   . GERD (gastroesophageal reflux disease)   . Hypercholesteremia   . OSA (obstructive sleep apnea)     Past Surgical History  Procedure Date  . Esophagogastroduodenoscopy   . Colonoscopy 03/07/01    Family History  Problem Relation Age of Onset  . Cancer Other     prostate   Social History:  reports that he has never smoked. He does not have any smokeless tobacco history on file. He reports that he does not drink alcohol or use illicit drugs.  Allergies: No Known Allergies  Prior to Admission medications   Medication Sig Start Date End Date Taking? Authorizing Provider  atorvastatin (LIPITOR) 40 MG tablet Take 40 mg by mouth daily.   Yes Historical Provider, MD  ibuprofen (ADVIL,MOTRIN) 200 MG tablet Take 400 mg by mouth every 6 (six) hours as needed. Patient used this medication for pain.   Yes Historical Provider, MD  triamterene-hydrochlorothiazide (MAXZIDE-25) 37.5-25 MG per tablet Take 1 tablet by mouth daily. 07/16/11 07/15/12 Yes Roderick Pee, MD     (Not in a hospital admission)  Results for orders placed during the hospital encounter of 05/19/12 (from the past 48 hour(s))  COMPREHENSIVE METABOLIC PANEL     Status: Abnormal   Collection Time   05/19/12  9:20 PM      Component Value Range Comment   Sodium 136  135 - 145 mEq/L    Potassium 3.5  3.5 - 5.1 mEq/L    Chloride 96  96 - 112 mEq/L    CO2 31  19  - 32 mEq/L    Glucose, Bld 110 (*) 70 - 99 mg/dL    BUN 17  6 - 23 mg/dL    Creatinine, Ser 1.61  0.50 - 1.35 mg/dL    Calcium 9.5  8.4 - 09.6 mg/dL    Total Protein 7.7  6.0 - 8.3 g/dL    Albumin 4.0  3.5 - 5.2 g/dL    AST 17  0 - 37 U/L    ALT 29  0 - 53 U/L    Alkaline Phosphatase 83  39 - 117 U/L    Total Bilirubin 0.6  0.3 - 1.2 mg/dL    GFR calc non Af Amer 89 (*) >90 mL/min    GFR calc Af Amer >90  >90 mL/min   CBC     Status: Abnormal   Collection Time   05/19/12  9:20 PM      Component Value Range Comment   WBC 14.6 (*) 4.0 - 10.5 K/uL    RBC 4.96  4.22 - 5.81 MIL/uL    Hemoglobin 15.3  13.0 - 17.0 g/dL    HCT 04.5  40.9 - 81.1 %    MCV 86.1  78.0 - 100.0 fL    MCH 30.8  26.0 - 34.0 pg    MCHC 35.8  30.0 - 36.0 g/dL  RDW 12.9  11.5 - 15.5 %    Platelets 273  150 - 400 K/uL   DIFFERENTIAL     Status: Abnormal   Collection Time   05/19/12  9:20 PM      Component Value Range Comment   Neutrophils Relative 69  43 - 77 %    Neutro Abs 10.1 (*) 1.7 - 7.7 K/uL    Lymphocytes Relative 18  12 - 46 %    Lymphs Abs 2.7  0.7 - 4.0 K/uL    Monocytes Relative 12  3 - 12 %    Monocytes Absolute 1.7 (*) 0.1 - 1.0 K/uL    Eosinophils Relative 1  0 - 5 %    Eosinophils Absolute 0.1  0.0 - 0.7 K/uL    Basophils Relative 0  0 - 1 %    Basophils Absolute 0.1  0.0 - 0.1 K/uL   URINALYSIS, ROUTINE W REFLEX MICROSCOPIC     Status: Normal   Collection Time   05/19/12 10:07 PM      Component Value Range Comment   Color, Urine YELLOW  YELLOW    APPearance CLEAR  CLEAR    Specific Gravity, Urine 1.025  1.005 - 1.030    pH 6.0  5.0 - 8.0    Glucose, UA NEGATIVE  NEGATIVE mg/dL    Hgb urine dipstick NEGATIVE  NEGATIVE    Bilirubin Urine NEGATIVE  NEGATIVE    Ketones, ur NEGATIVE  NEGATIVE mg/dL    Protein, ur NEGATIVE  NEGATIVE mg/dL    Urobilinogen, UA 1.0  0.0 - 1.0 mg/dL    Nitrite NEGATIVE  NEGATIVE    Leukocytes, UA NEGATIVE  NEGATIVE MICROSCOPIC NOT DONE ON URINES WITH NEGATIVE  PROTEIN, BLOOD, LEUKOCYTES, NITRITE, OR GLUCOSE <1000 mg/dL.   Dg Chest 2 View  05/20/2012  *RADIOLOGY REPORT*  Clinical Data: Right lower quadrant abdominal pain.  Transfer admission chest radiograph.  CHEST - 2 VIEW  Comparison: None.  Findings: The lungs are well-aerated and clear.  There is no evidence of focal opacification, pleural effusion or pneumothorax.  The heart is normal in size; the mediastinal contour is within normal limits.  No acute osseous abnormalities are seen.  IMPRESSION: No acute cardiopulmonary process seen.  Original Report Authenticated By: Tonia Ghent, M.D.   Ct Abdomen Pelvis W Contrast  05/20/2012  *RADIOLOGY REPORT*  Clinical Data: Right lower quadrant pain.  Leukocytosis.  CT ABDOMEN AND PELVIS WITH CONTRAST  Technique:  Multidetector CT imaging of the abdomen and pelvis was performed following the standard protocol during bolus administration of intravenous contrast.  Contrast: OMNIPAQUE IOHEXOL 300 MG/ML  SOLN  Comparison: None.  Findings: The abdominal parenchymal organs are normal in appearance.  Gallbladder is unremarkable.  No evidence of hydronephrosis.  No soft tissue masses or lymphadenopathy identified within the abdomen or pelvis.  The appendix is diffusely thickened and there is moderate periappendiceal inflammatory changes.  This is consistent with acute appendicitis.  No evidence of abscess or free fluid.  No evidence of bowel obstruction.  IMPRESSION:  1.  Positive for acute appendicitis. 2.  No evidence of abscess or other complication.  Original Report Authenticated By: Danae Orleans, M.D.    Review of Systems  Constitutional: Positive for fever and chills.  Respiratory: Negative for shortness of breath.        He has sleep apnea but does not use a CPAP machine.  Cardiovascular: Negative for chest pain.       He  describes a history of tachycardia that was evaluated by a cardiologist but no heart disease was detected.  Gastrointestinal: Positive  for nausea, abdominal pain and diarrhea.  Genitourinary: Negative for dysuria and hematuria.  Neurological: Positive for headaches. Negative for seizures.  Endo/Heme/Allergies: Does not bruise/bleed easily.    Blood pressure 108/66, pulse 76, temperature 98.5 F (36.9 C), temperature source Oral, resp. rate 18, height 6\' 1"  (1.854 m), weight 247 lb (112.038 kg), SpO2 97.00%. Physical Exam  Constitutional:       Overweight male in no acute distress.  HENT:  Head: Normocephalic and atraumatic.  Eyes: EOM are normal. No scleral icterus.  Neck: Neck supple.  Cardiovascular: Normal rate and regular rhythm.   Respiratory: Effort normal and breath sounds normal.  GI: Soft. He exhibits no mass. There is tenderness (In the right lower quadrant to palpation and percussion). There is guarding.  Musculoskeletal: He exhibits no edema.  Lymphadenopathy:    He has no cervical adenopathy.  Skin: Skin is warm and dry.  Psychiatric: He has a normal mood and affect. His behavior is normal.     Assessment/Plan Acute appendicitis.  Plan: Laparoscopic possible open appendectomy. He may need a CPAP machine postoperatively. He has received IV antibiotics.  I have discussed the procedure and risks of appendectomy. The risks include but are not limited to bleeding, infection, wound problems, anesthesia, injury to intra-abdominal organs, he seems to understand and agrees with the plan.  Tomeika Weinmann J 05/20/2012, 3:52 AM

## 2012-05-20 NOTE — Anesthesia Procedure Notes (Signed)
Procedure Name: Intubation Date/Time: 05/20/2012 4:44 AM Performed by: Leroy Libman L Patient Re-evaluated:Patient Re-evaluated prior to inductionOxygen Delivery Method: Circle system utilized Preoxygenation: Pre-oxygenation with 100% oxygen Intubation Type: IV induction, Cricoid Pressure applied and Rapid sequence Laryngoscope Size: Miller and 3 Grade View: Grade II Tube type: Oral Tube size: 8.0 mm Number of attempts: 1 Airway Equipment and Method: Stylet Placement Confirmation: ETT inserted through vocal cords under direct vision,  breath sounds checked- equal and bilateral and positive ETCO2 Secured at: 22 cm Tube secured with: Tape Dental Injury: Teeth and Oropharynx as per pre-operative assessment

## 2012-05-20 NOTE — ED Notes (Signed)
WUJ:[id number]<JY> Expected date:<BR> Expected time:<BR> Means of arrival:<BR> Comments:<BR> Transfer from [facility]

## 2012-05-20 NOTE — Preoperative (Signed)
Beta Blockers   Reason not to administer Beta Blockers:Not Applicable 

## 2012-05-21 MED ORDER — ACETAMINOPHEN 325 MG PO TABS: 650.0000 mg | ORAL_TABLET | Freq: Four times a day (QID) | ORAL | Status: AC | PRN

## 2012-05-21 MED ORDER — OXYCODONE-ACETAMINOPHEN 5-325 MG PO TABS: 1.0000 | ORAL_TABLET | ORAL | Status: AC | PRN

## 2012-05-21 MED ORDER — SODIUM CHLORIDE 0.9 % IJ SOLN: 3.0000 mL | INTRAMUSCULAR | Status: DC | PRN

## 2012-05-21 NOTE — Progress Notes (Signed)
Pt for d/c home today. IV d/c'd. Steristrips CDI to 3 abdominal incisions. No noted drainage nor s/sx of infection to abdominal surgical incision. Pt able to tolerate regular diet w/o N/V. D/C instructions & RX given with verbalized understanding. No changes in am assessments today.

## 2012-05-21 NOTE — Progress Notes (Signed)
1 Day Post-Op  Subjective: Had a burger last pm for supper.  Just had a pain med.  Thinks he's doing well. Some flatus last PM.  Objective: Vital signs in last 24 hours: Temp:  [97.4 F (36.3 C)-98.5 F (36.9 C)] 97.8 F (36.6 C) (06/19 0600) Pulse Rate:  [63-80] 63  (06/19 0600) Resp:  [12-14] 14  (06/18 2144) BP: (100-122)/(63-73) 100/63 mmHg (06/19 0600) SpO2:  [94 %-98 %] 98 % (06/19 0600) Weight:  [112.401 kg (247 lb 12.8 oz)] 112.401 kg (247 lb 12.8 oz) (06/18 1145) Last BM Date: 05/19/12 840 po yesterday, VSS, no labs  Intake/Output from previous day: 06/18 0701 - 06/19 0700 In: 3692.1 [P.O.:840; I.V.:2352.1; IV Piggyback:500] Out: 2225 [Urine:2225] Intake/Output this shift:    General appearance: alert, cooperative and no distress Resp: clear to auscultation bilaterally GI: soft, few bowel sounds, incisions ok.  not distended.  Lab Results:   [facility] 05/19/12 2120  WBC 14.6*  HGB 15.3  HCT 42.7  PLT 273    BMET  Basename 05/19/12 2120  NA 136  K 3.5  CL 96  CO2 31  GLUCOSE 110*  BUN 17  CREATININE 1.00  CALCIUM 9.5   PT/INR No results found for this basename: LABPROT:2,INR:2 in the last 72 hours   Lab 05/19/12 2120  AST 17  ALT 29  ALKPHOS 83  BILITOT 0.6  PROT 7.7  ALBUMIN 4.0     Lipase  No results found for this basename: lipase     Studies/Results: Dg Chest 2 View  05/20/2012  *RADIOLOGY REPORT*  Clinical Data: Right lower quadrant abdominal pain.  Transfer admission chest radiograph.  CHEST - 2 VIEW  Comparison: None.  Findings: The lungs are well-aerated and clear.  There is no evidence of focal opacification, pleural effusion or pneumothorax.  The heart is normal in size; the mediastinal contour is within normal limits.  No acute osseous abnormalities are seen.  IMPRESSION: No acute cardiopulmonary process seen.  Original Report Authenticated By: Tonia Ghent, M.D.   Ct Abdomen Pelvis W Contrast  05/20/2012  *RADIOLOGY REPORT*   Clinical Data: Right lower quadrant pain.  Leukocytosis.  CT ABDOMEN AND PELVIS WITH CONTRAST  Technique:  Multidetector CT imaging of the abdomen and pelvis was performed following the standard protocol during bolus administration of intravenous contrast.  Contrast: OMNIPAQUE IOHEXOL 300 MG/ML  SOLN  Comparison: None.  Findings: The abdominal parenchymal organs are normal in appearance.  Gallbladder is unremarkable.  No evidence of hydronephrosis.  No soft tissue masses or lymphadenopathy identified within the abdomen or pelvis.  The appendix is diffusely thickened and there is moderate periappendiceal inflammatory changes.  This is consistent with acute appendicitis.  No evidence of abscess or free fluid.  No evidence of bowel obstruction.  IMPRESSION:  1.  Positive for acute appendicitis. 2.  No evidence of abscess or other complication.  Original Report Authenticated By: Danae Orleans, M.D.    Medications:    . ampicillin-sulbactam (UNASYN) IV  3 g Intravenous Q6H  . ketorolac      . ketorolac      . promethazine        Assessment/Plan Acute appendicitis without mention of peritonitis  .  Migraine    .  Eczema    .  Allergy    .  GERD (gastroesophageal reflux disease)    .  Hypercholesteremia    .  OSA (obstructive sleep apnea)     Plan:  Mobilize more, see  how he does this AM, and if OK may let him go home later today.       LOS: 2 days    Fannie Alomar 05/21/2012

## 2012-05-21 NOTE — Discharge Summary (Signed)
Physician Discharge Summary  Patient ID: Adrian Powers MRN: [id number] DOB/AGE: [birth year] 45 y.o.  Admit date: 05/19/2012 Discharge date: 05/21/2012  Admission Diagnoses: Acute Appendicitis Patient Active Problem List  Diagnosis  . MOOD SWINGS  . ALLERGIC RHINITIS  . GERD  . DYSHIDROSIS  . Peripheral edema   Discharge Diagnoses: Acute appendicitis without mention of peritonitis  .  Migraine    .  Eczema    .  Allergy    .  GERD (gastroesophageal reflux disease)    .  Hypercholesteremia    .  OSA (obstructive sleep apnea)      Active Problems:  * No active hospital problems. *    PROCEDURES: Laparoscopic Appendectomy 05/20/12 Dr. Huntley Dec Course: He was transferred from the Medical [facility] in [facility] because of acute appendicitis. Yesterday at noon he developed some sharp lower abdominal pain that persisted. He had subjective fever and chills as well as nausea. The pain eventually radiated to the right lower quadrant. He was evaluated at the Medical Center and found to have acute appendicitis by clinical exam and CT scan. He is here now for appendectomy. He underwent surgery by Dr. Abbey Chatters. And finished in early AM.  He has had his diet advanced and he was mobilized He's made good progress.  We plan to send him home after lunch today. Follow up in 2 weeks with DR. Rosenbower.    Disposition: Final discharge disposition not confirmed   Medication List  As of 05/21/2012  1:41 PM   TAKE these medications         acetaminophen 325 MG tablet   Commonly known as: TYLENOL   Take 2 tablets (650 mg total) by mouth every 6 (six) hours as needed for pain.      atorvastatin 40 MG tablet   Commonly known as: LIPITOR   Take 40 mg by mouth daily.      ibuprofen 200 MG tablet   Commonly known as: ADVIL,MOTRIN   Take 400 mg by mouth every 6 (six) hours as needed. Patient used this medication for pain.      oxyCODONE-acetaminophen 5-325 MG per tablet   Commonly known as: PERCOCET   Take 1-2 tablets by mouth every 4 (four) hours as needed.      triamterene-hydrochlorothiazide 37.5-25 MG per tablet   Commonly known as: MAXZIDE-25   Take 1 tablet by mouth daily.             SignedSherrie George 05/21/2012, 1:41 PM

## 2012-05-21 NOTE — Discharge Instructions (Signed)
Laparoscopic Appendectomy Appendectomy is surgery to remove the appendix. Laparoscopic surgery uses several small cuts (incisions) instead of one large incision. Laparoscopic surgery offers a shorter recovery time and less discomfort. LET YOUR CAREGIVER KNOW ABOUT:  Allergies to food or medicine.   Medicines taken, including vitamins, dietary supplements, herbs, eyedrops, over-the-counter medicines, and creams.   Use of steroids (by mouth or creams).   Previous problems with anesthetics or numbing medicines.   History of bleeding problems or blood clots.   Previous surgery.   Other health problems, including diabetes, heart problems, lung problems, and kidney problems.   Possibility of pregnancy, if this applies.  RISKS AND COMPLICATIONS  Infection. A germ starts growing in the wound. This can usually be treated with antibiotics. In some cases, the wound will need to be opened and cleaned.   Bleeding.   Damage to other organs.   Sores (abscesses).   Chronic pain at the incision sites. This is defined as pain that lasts for more than 3 months.   Blood clots in the legs that may rarely travel to the lungs.   Infection in the lungs (pneumonia).  BEFORE THE PROCEDURE Appendectomy is usually performed immediately after an inflamed appendix (appendicitis) is diagnosed. No preparation is necessary ahead of this procedure. PROCEDURE  You will be given medicine that makes you sleep (general anesthetic). After you are asleep, a flexible tube (catheter) may be inserted into your bladder to drain your urine during surgery. The tube is removed before you wake up after surgery. When you are asleep, carbondioxide gas will be used to inflate your abdomen. This will allow your surgeon to see inside your abdomen and perform your surgery. Three small incisions will be made in your abdomen. Your surgeon will insert a thin, lighted tube (laparoscope) through one of the incisions. Your surgeon will  look through the laparoscope while performing the surgery. Other tools will be inserted through the other incisions. Laparoscopic procedures may not be appropriate when:  There is major scarring from a previous surgery.   The patient has bleeding disorders.   A pregnancy is near term.   There are other conditions which make the laparoscopic procedure impossible, such as an advanced infection or a ruptured appendix.  If your surgeon feels it is not safe to continue with the laparoscopic procedure, he or she will perform an open surgery instead. This gives the surgeon a larger view and more space to work. Open surgery requires a longer recovery time. After your appendix is removed, your incisions will be closed with stitches (sutures) or skin adhesive. AFTER THE PROCEDURE You will be taken to a recovery room. When the anesthesia has worn off, you will be returned to your hospital room. You will be given pain medicines to keep you comfortable. Ask your caregiver how long your hospital stay will be. Document Released: 07/03/2004 Document Revised: 11/08/2011 Document Reviewed: 05/29/2011 [facility] Patient Information 2012 [place], [place].CCS ______[facility] LAPAROSCOPIC SURGERY: POST OP INSTRUCTIONS Always review your discharge instruction sheet given to you by the facility where your surgery was performed. IF YOU HAVE DISABILITY OR FAMILY LEAVE FORMS, YOU MUST BRING THEM TO THE OFFICE FOR PROCESSING.   DO NOT GIVE THEM TO YOUR DOCTOR.  1. A prescription for pain medication may be given to you upon discharge.  Take your pain medication as prescribed, if needed.  If narcotic pain medicine is not needed, then you may take acetaminophen (Tylenol) or ibuprofen (Advil) as needed. 2. Take your  usually prescribed medications unless otherwise directed. 3. If you need a refill on your pain medication, please contact your pharmacy.  They will contact our office to request authorization.  Prescriptions will not be filled after 5pm or on week-ends. 4. You should follow a light diet the first few days after arrival home, such as soup and crackers, etc.  Be sure to include lots of fluids daily. 5. Most patients will experience some swelling and bruising in the area of the incisions.  Ice packs will help.  Swelling and bruising can take several days to resolve.  6. It is common to experience some constipation if taking pain medication after surgery.  Increasing fluid intake and taking a stool softener (such as Colace) will usually help or prevent this problem from occurring.  A mild laxative (Milk of Magnesia or Miralax) should be taken according to package instructions if there are no bowel movements after 48 hours. 7. Unless discharge instructions indicate otherwise, you may remove your bandages 24-48 hours after surgery, and you may shower at that time.  You may have steri-strips (small skin tapes) in place directly over the incision.  These strips should be left on the skin for 7-10 days.  If your surgeon used skin glue on the incision, you may shower in 24 hours.  The glue will flake off over the next 2-3 weeks.  Any sutures or staples will be removed at the office during your follow-up visit. 8. ACTIVITIES:  You may resume regular (light) daily activities beginning the next day--such as daily self-care, walking, climbing stairs--gradually increasing activities as tolerated.  You may have sexual intercourse when it is comfortable.  Refrain from any heavy lifting or straining until approved by your doctor. a. You may drive when you are no longer taking prescription pain medication, you can comfortably wear a seatbelt, and you can safely maneuver your car and apply brakes. b. RETURN TO WORK:  __________________________________________________________ 9. You should see your doctor in the office for a follow-up appointment approximately 2-3 weeks after your surgery.  Make sure that you call for  this appointment within a day or two after you arrive home to insure a convenient appointment time. 10. OTHER INSTRUCTIONS: __________________________________________________________________________________________________________________________ __________________________________________________________________________________________________________________________ WHEN TO CALL YOUR DOCTOR: 1. Fever over 101.0 2. Inability to urinate 3. Continued bleeding from incision. 4. Increased pain, redness, or drainage from the incision. 5. Increasing abdominal pain  The clinic staff is available to answer your questions during regular business hours.  Please don't hesitate to call and ask to speak to one of the nurses for clinical concerns.  If you have a medical emergency, go to the nearest emergency room or call 911.  A surgeon from [facility] is always on call at the hospital. [address], [place], [place]  [address] ? [address] [address], [place], [place]   [address] [phone] ? [phone] ? FAX [phone] Web site: [email or web address]

## 2012-05-21 NOTE — Progress Notes (Signed)
Patient complaining of tenderness in his R fingers and forearm, r/t IV. Fluids currently running at 125cc/hr.Site shows no signs of infiltration or phlebitis.  On call MD paged, Dr. Jarrett Soho with order to NSL IV. Will continue to monitor patient.

## 2012-05-22 ENCOUNTER — Encounter (HOSPITAL_COMMUNITY): Payer: Self-pay | Admitting: General Surgery

## 2012-05-26 NOTE — Progress Notes (Signed)
He left before being seen by me

## 2012-06-04 ENCOUNTER — Telehealth (INDEPENDENT_AMBULATORY_CARE_PROVIDER_SITE_OTHER): Payer: Self-pay

## 2012-06-04 NOTE — Telephone Encounter (Signed)
LMOV pt's appt on 06/23/12 with Dr. Abbey Chatters.

## 2012-06-23 ENCOUNTER — Ambulatory Visit (INDEPENDENT_AMBULATORY_CARE_PROVIDER_SITE_OTHER): Payer: BC Managed Care – PPO | Admitting: General Surgery

## 2012-06-23 ENCOUNTER — Encounter (INDEPENDENT_AMBULATORY_CARE_PROVIDER_SITE_OTHER): Payer: Self-pay | Admitting: General Surgery

## 2012-06-23 VITALS — BP 130/80 | HR 72 | Temp 97.4°F | Resp 18 | Ht 73.0 in | Wt 250.8 lb

## 2012-06-23 DIAGNOSIS — Z9889 Other specified postprocedural states: Secondary | ICD-10-CM

## 2012-06-23 NOTE — Patient Instructions (Signed)
Diet and activities as tolerated. 

## 2012-06-23 NOTE — Progress Notes (Signed)
Procedure:  Laparoscopic appendectomy  Date:  05/20/2012  Pathology:  Acute appendicitis  History:  He is here for his first postoperative visit and is doing well. He has no complaints. 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  Exam: Abdomen is soft, incisions are clean, dry, and intact.  Assessment:  Doing well postoperatively.  Plan:  Resume all normal activities including full duty work. Return visit as needed.

## 2012-10-25 IMAGING — CT CT ABD-PELV W/ CM
2 of 5 series · 16 of 46 positions shown, 18 images · IV contrast (APPLIED)
Comparison: None.

CLINICAL DATA: Right lower quadrant pain.  Leukocytosis.

CT ABDOMEN AND PELVIS WITH CONTRAST
TECHNIQUE: Multidetector CT imaging of the abdomen and pelvis was
performed following the standard protocol during bolus
administration of intravenous contrast.
Contrast: 100mL OMNIPAQUE IOHEXOL 300 MG/ML  SOLN

[Series 2: abd/pelvis 5.0 b31f · axial · 0.82mm/px · z∈[-524,-104]mm · 13 of 94 slices shown, 15 images]
[im 5/94  soft-tissue]
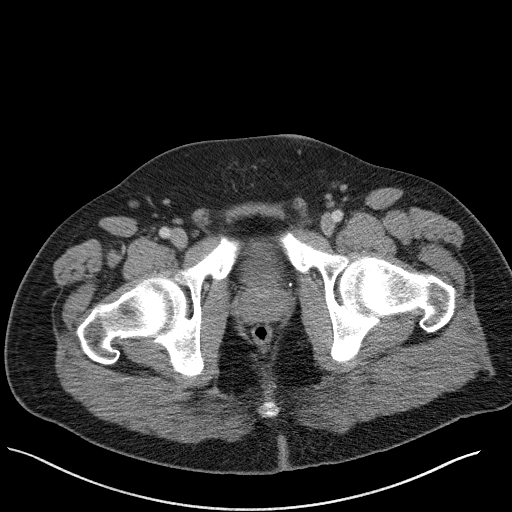
[im 5/94  bone]
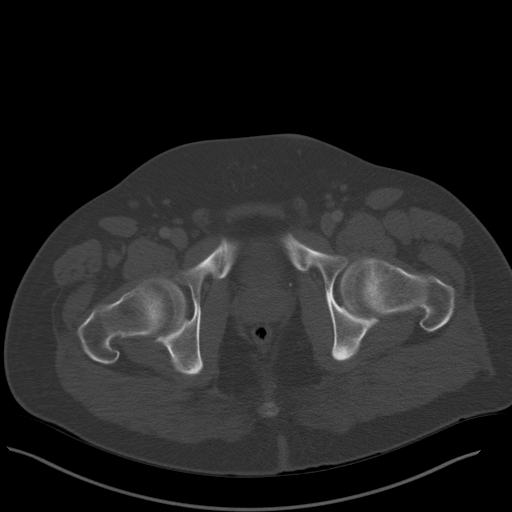
[im 14/94  soft-tissue]
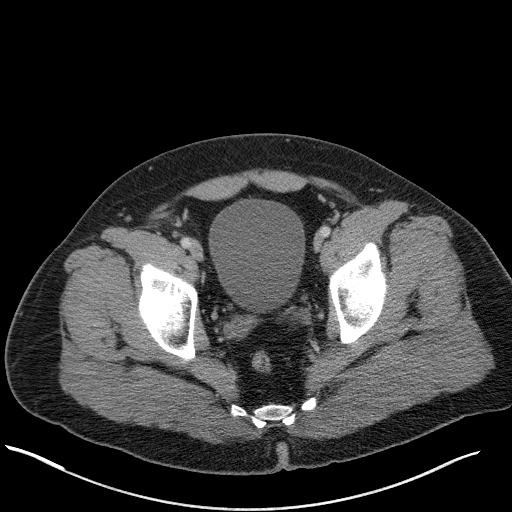
[im 18/94  soft-tissue]
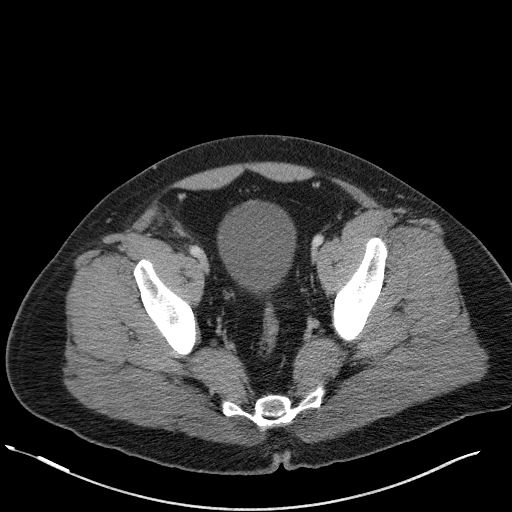
[im 27/94  soft-tissue]
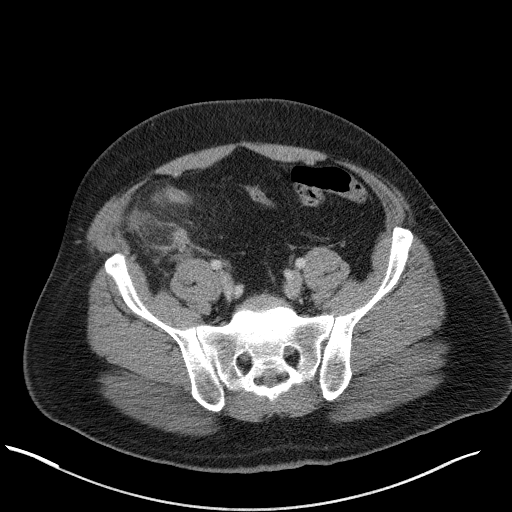
[im 32/94  soft-tissue]
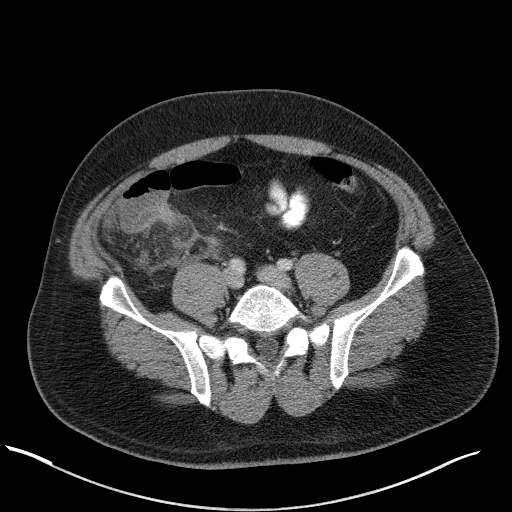
[im 40/94  soft-tissue]
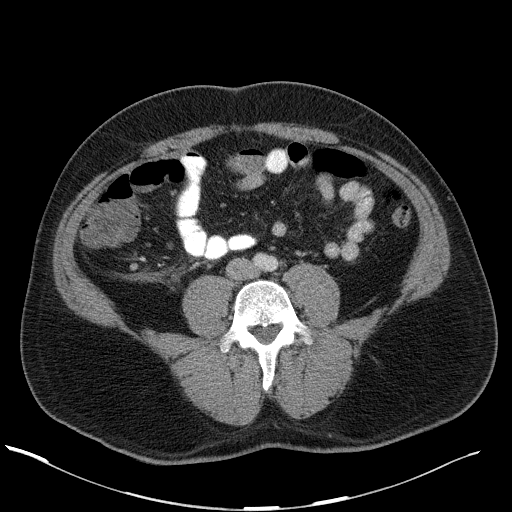
[im 49/94  soft-tissue]
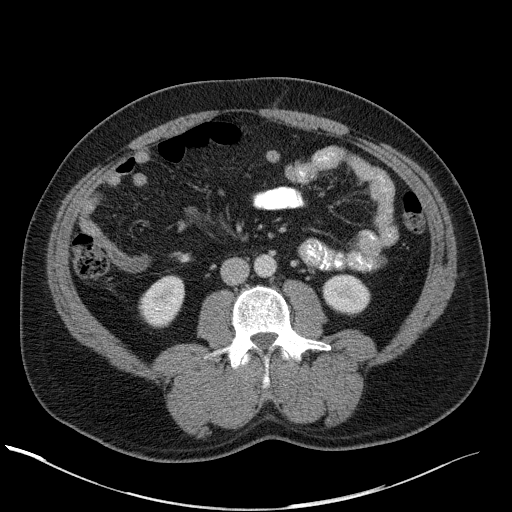
[im 54/94  soft-tissue]
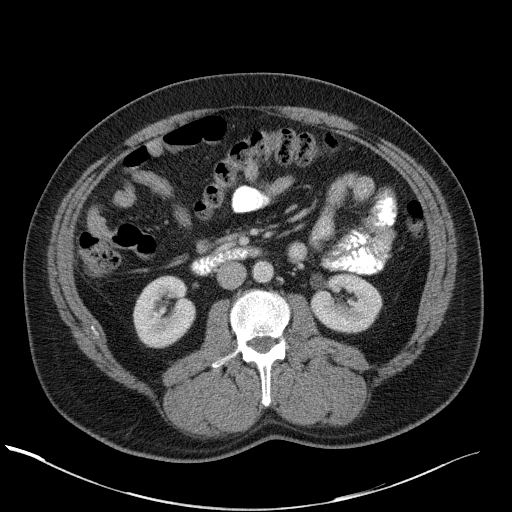
[im 63/94  soft-tissue]
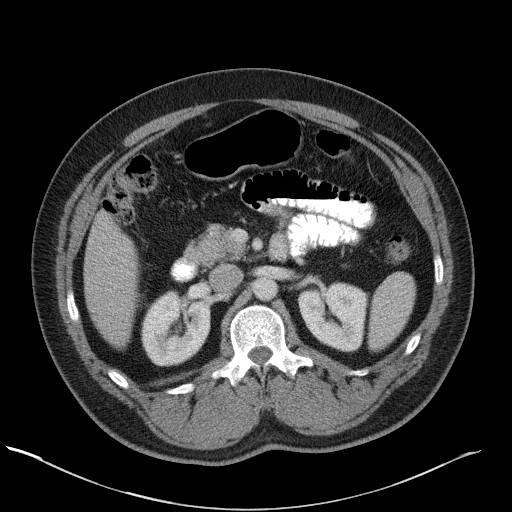
[im 63/94  bone]
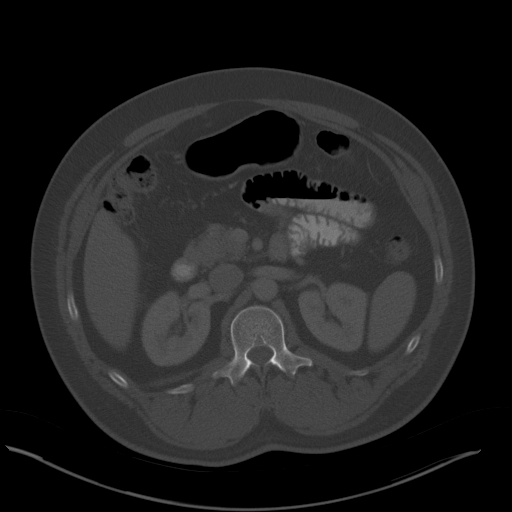
[im 67/94  soft-tissue]
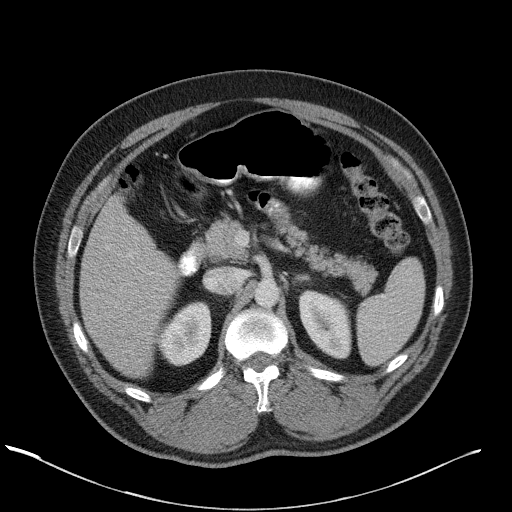
[im 76/94  soft-tissue]
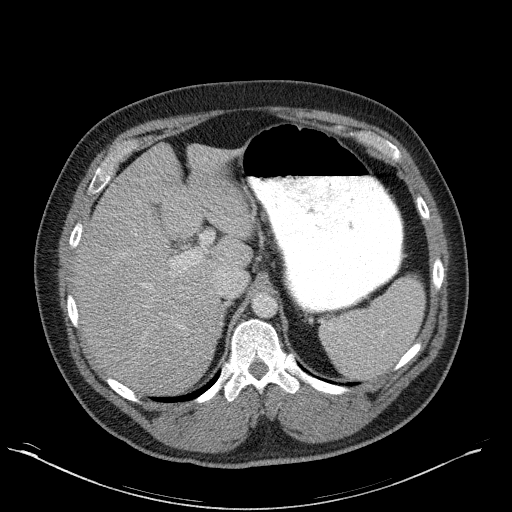
[im 80/94  soft-tissue]
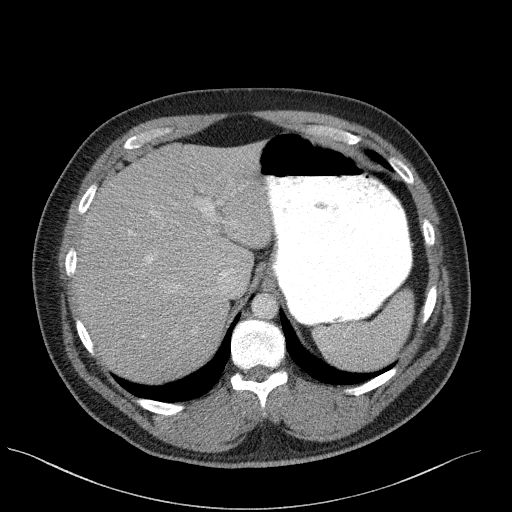
[im 89/94  soft-tissue]
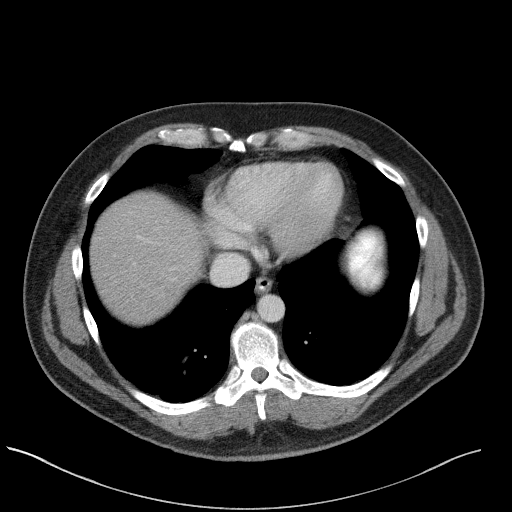

[Series 5: abd/pelvis 3.0 coronal · coronal · 0.90mm/px · 3 of 87 slices shown]
[im 29/87  soft-tissue]
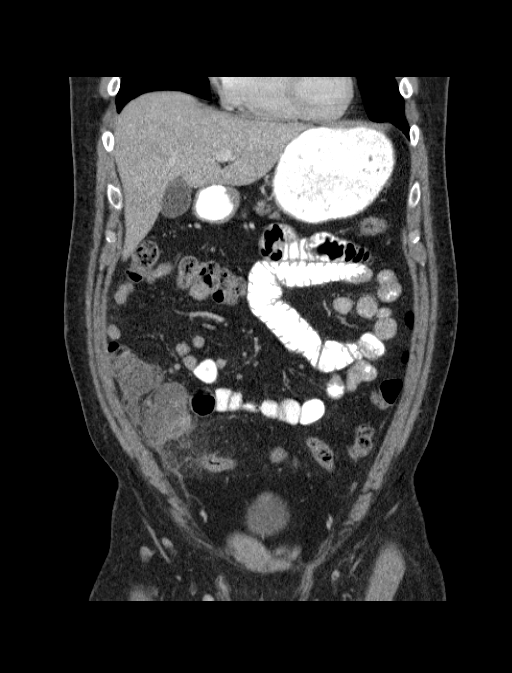
[im 39/87  soft-tissue]
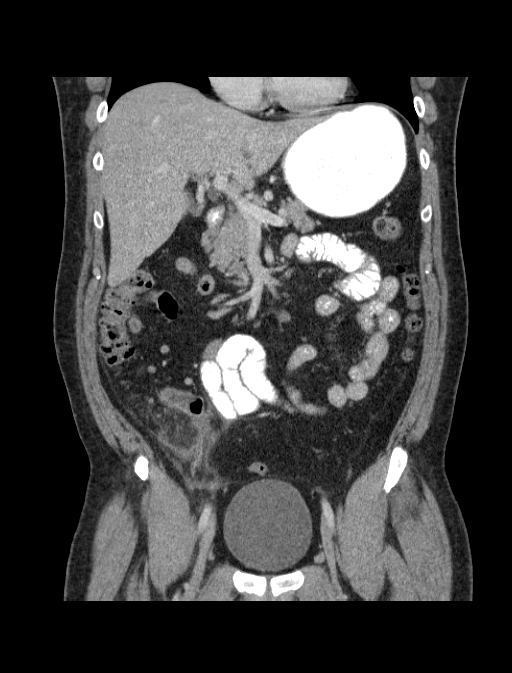
[im 48/87  soft-tissue]
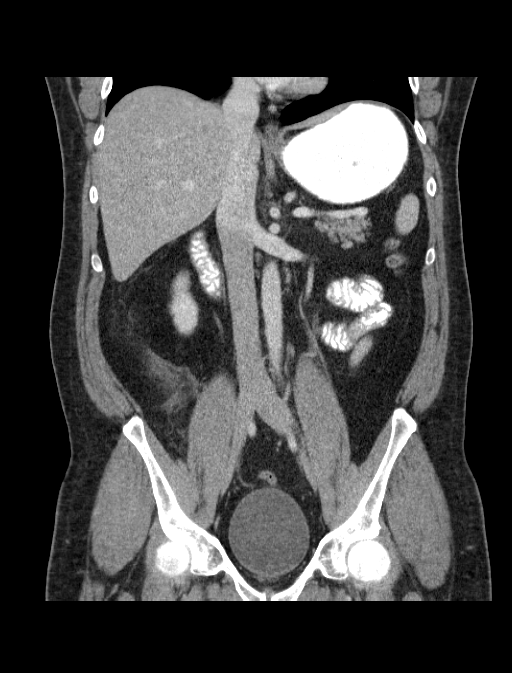

[16 of 46 positions shown; findings below may reference images not displayed]

FINDINGS: The abdominal parenchymal organs are normal in
appearance.  Gallbladder is unremarkable.  No evidence of
hydronephrosis.  No soft tissue masses or lymphadenopathy
identified within the abdomen or pelvis.

The appendix is diffusely thickened and there is moderate
periappendiceal inflammatory changes.  This is consistent with
acute appendicitis.  No evidence of abscess or free fluid.  No
evidence of bowel obstruction.
IMPRESSION: 1.  Positive for acute appendicitis.
2.  No evidence of abscess or other complication.

## 2014-07-18 ENCOUNTER — Encounter (HOSPITAL_BASED_OUTPATIENT_CLINIC_OR_DEPARTMENT_OTHER): Payer: Self-pay | Admitting: Emergency Medicine

## 2014-07-18 ENCOUNTER — Emergency Department (HOSPITAL_BASED_OUTPATIENT_CLINIC_OR_DEPARTMENT_OTHER)
Admission: EM | Admit: 2014-07-18 | Discharge: 2014-07-18 | Disposition: A | Discharge status: Home / Self Care | Payer: BC Managed Care – PPO | Attending: Emergency Medicine | Admitting: Emergency Medicine

## 2014-07-18 DIAGNOSIS — Z79899 Other long term (current) drug therapy: Secondary | ICD-10-CM | POA: Insufficient documentation

## 2014-07-18 DIAGNOSIS — K089 Disorder of teeth and supporting structures, unspecified: Secondary | ICD-10-CM | POA: Insufficient documentation

## 2014-07-18 DIAGNOSIS — Z8679 Personal history of other diseases of the circulatory system: Secondary | ICD-10-CM | POA: Insufficient documentation

## 2014-07-18 DIAGNOSIS — K0889 Other specified disorders of teeth and supporting structures: Secondary | ICD-10-CM

## 2014-07-18 DIAGNOSIS — K047 Periapical abscess without sinus: Secondary | ICD-10-CM

## 2014-07-18 DIAGNOSIS — E7800 Pure hypercholesterolemia, unspecified: Secondary | ICD-10-CM | POA: Insufficient documentation

## 2014-07-18 DIAGNOSIS — Z872 Personal history of diseases of the skin and subcutaneous tissue: Secondary | ICD-10-CM | POA: Diagnosis not present

## 2014-07-18 DIAGNOSIS — H00019 Hordeolum externum unspecified eye, unspecified eyelid: Secondary | ICD-10-CM | POA: Insufficient documentation

## 2014-07-18 DIAGNOSIS — K044 Acute apical periodontitis of pulpal origin: Secondary | ICD-10-CM | POA: Diagnosis not present

## 2014-07-18 DIAGNOSIS — H00013 Hordeolum externum right eye, unspecified eyelid: Secondary | ICD-10-CM

## 2014-07-18 MED ORDER — OXYCODONE-ACETAMINOPHEN 5-325 MG PO TABS
2.0000 | ORAL_TABLET | Freq: Once | ORAL | Status: AC
  Administered 2014-07-18: 2 via ORAL
  Filled 2014-07-18: qty 2

## 2014-07-18 MED ORDER — AMOXICILLIN 500 MG PO CAPS: 500.0000 mg | ORAL_CAPSULE | Freq: Three times a day (TID) | ORAL | Status: AC

## 2014-07-18 MED ORDER — OXYCODONE-ACETAMINOPHEN 5-325 MG PO TABS
1.0000 | ORAL_TABLET | Freq: Four times a day (QID) | ORAL | Status: AC | PRN
Start: 2014-07-18 — End: ?

## 2014-07-18 NOTE — ED Provider Notes (Signed)
CSN: [id number]     Arrival date & time 07/18/14  1911 History   [id number] MD Initiated Contact with Patient 07/18/14 2120     Chief Complaint  Patient presents with  . Dental Pain     (Consider location/radiation/quality/duration/timing/severity/associated sxs/prior Treatment) HPI Comments: Patient is a 48 year old male who presents to the emergency department complaining of dental pain x3 days, progressively worsening. He reports he has right lower and left upper dental pain described as sharp and throbbing, worse with hot and cold or chewing, radiating towards his right ear. Pain woke him up from sleep this morning. States he did not eat anything today due to the pain. He tried taking ibuprofen with no relief. States his teeth have been broken for a while. He does not have a dentist at this time. No fever or chills. No difficulty swallowing. He is also reporting redness and burning to his right upper eyelid for 2 weeks. He reports he feels like something is rubbing up against his eye. Today when he rubbed his eye lid, a small amount of pus came out and got into his eye. No vision changes.  Patient is a 48 y.o. male presenting with tooth pain. The history is provided by the patient and a significant other.  Dental Pain   Past Medical History  Diagnosis Date  . Migraine   . Eczema   . Allergy   . GERD (gastroesophageal reflux disease)   . Hypercholesteremia   . OSA (obstructive sleep apnea)    Past Surgical History  Procedure Laterality Date  . Laparoscopic appendectomy  05/20/2012    Procedure: APPENDECTOMY LAPAROSCOPIC;  Surgeon: Adolph Pollackodd J Rosenbower, MD;  Location: [facility];  Service: General;  Laterality: N/A;   Family History  Problem Relation Age of Onset  . Cancer Other     prostate   History  Substance Use Topics  . Smoking status: Never Smoker   . Smokeless tobacco: Not on file  . Alcohol Use: No    Review of Systems  HENT: Positive for dental problem.   Eyes:       +  eyelid burning and redness.  All other systems reviewed and are negative.     Allergies  Review of patient's allergies indicates no known allergies.  Home Medications   Prior to Admission medications   Medication Sig Start Date End Date Taking? Authorizing Provider  ibuprofen (ADVIL,MOTRIN) 200 MG tablet Take 400 mg by mouth every 6 (six) hours as needed. Patient used this medication for pain.   Yes Historical Provider, MD  amoxicillin (AMOXIL) 500 MG capsule Take 1 capsule (500 mg total) by mouth 3 (three) times daily. 07/18/14   Trevor Maceobyn M Albert, PA-C  atorvastatin (LIPITOR) 40 MG tablet Take 40 mg by mouth daily.    Historical Provider, MD  oxyCODONE-acetaminophen (PERCOCET) 5-325 MG per tablet Take 1-2 tablets by mouth every 6 (six) hours as needed for severe pain. 07/18/14   Trevor Maceobyn M Albert, PA-C  triamterene-hydrochlorothiazide (MAXZIDE-25) 37.5-25 MG per tablet Take 1 tablet by mouth daily. 07/16/11 07/15/12  Roderick PeeJeffrey A Todd, MD   BP 148/89  Pulse 67  Temp(Src) 98.2 F (36.8 C) (Oral)  Resp 24  Ht 6\' 1"  (1.854 m)  Wt 250 lb (113.399 kg)  BMI 32.99 kg/m2  SpO2 100% Physical Exam  Nursing note and vitals reviewed. Constitutional: He is oriented to person, place, and time. He appears well-developed and well-nourished. No distress.  HENT:  Head: Normocephalic and atraumatic.  Mouth/Throat: Uvula is  midline.  Poor dentition, multiple dental caries and tooth decay. Right lower second molar broken in half with half of the tooth missing with surrounding erythema and edema, no abscess. Left upper first and second molar with tooth decay, surrounding erythema and edema no abscess.  Eyes: Conjunctivae and EOM are normal. Right eye exhibits hordeolum.  Neck: Normal range of motion. Neck supple.  Cardiovascular: Normal rate, regular rhythm and normal heart sounds.   Pulmonary/Chest: Effort normal and breath sounds normal.  Musculoskeletal: Normal range of motion. He exhibits no edema.   Lymphadenopathy:       Head (right side): No submandibular adenopathy present.       Head (left side): No submandibular adenopathy present.    He has no cervical adenopathy.  Neurological: He is alert and oriented to person, place, and time.  Skin: Skin is warm and dry.  Psychiatric: He has a normal mood and affect. His behavior is normal.    ED Course  Procedures (including critical care time) Labs Review Labs Reviewed - No data to display  Imaging Review No results found.   EKG Interpretation None      MDM   Final diagnoses:  Pain, dental  Dental infection  Stye external, right    Dental pain associated with dental infection. No evidence of dental abscess. Patient is afebrile, non toxic appearing and swallowing secretions well. I gave patient referral to dentist and stressed the importance of dental follow up for ultimate management of dental pain. I will also give amoxicillin and pain control. Regarding stye, advised warm compresses. Patient voices understanding and is agreeable to plan.  Trevor Mace, PA-C 07/18/14 2207

## 2014-07-18 NOTE — Discharge Instructions (Signed)
Take antibiotic to completion. Take percocet for severe pain only. No driving or operating heavy machinery while taking percocet. This medication may cause drowsiness. Follow up with the dentist. Apply warm compresses to your eye intermittently throughout the day.  Dental Pain A tooth ache may be caused by cavities (tooth decay). Cavities expose the nerve of the tooth to air and hot or cold temperatures. It may come from an infection or abscess (also called a boil or furuncle) around your tooth. It is also often caused by dental caries (tooth decay). This causes the pain you are having. DIAGNOSIS  Your caregiver can diagnose this problem by exam. TREATMENT   If caused by an infection, it may be treated with medications which kill germs (antibiotics) and pain medications as prescribed by your caregiver. Take medications as directed.  Only take over-the-counter or prescription medicines for pain, discomfort, or fever as directed by your caregiver.  Whether the tooth ache today is caused by infection or dental disease, you should see your dentist as soon as possible for further care. SEEK MEDICAL CARE IF: The exam and treatment you received today has been provided on an emergency basis only. This is not a substitute for complete medical or dental care. If your problem worsens or new problems (symptoms) appear, and you are unable to meet with your dentist, call or return to this location. SEEK IMMEDIATE MEDICAL CARE IF:   You have a fever.  You develop redness and swelling of your face, jaw, or neck.  You are unable to open your mouth.  You have severe pain uncontrolled by pain medicine. MAKE SURE YOU:   Understand these instructions.  Will watch your condition.  Will get help right away if you are not doing well or get worse. Document Released: 11/19/2005 Document Revised: 02/11/2012 Document Reviewed: 07/07/2008 [facility] Patient Information 2015 [place]. This information is not  intended to replace advice given to you by your health care provider. Make sure you discuss any questions you have with your health care provider.  Dental Care and Dentist Visits Dental care supports good overall health. Regular dental visits can also help you avoid dental pain, bleeding, infection, and other more serious health problems in the future. It is important to keep the mouth healthy because diseases in the teeth, gums, and other oral tissues can spread to other areas of the body. Some problems, such as diabetes, heart disease, and pre-term labor have been associated with poor oral health.  See your dentist every 6 months. If you experience emergency problems such as a toothache or broken tooth, go to the dentist right away. If you see your dentist regularly, you may catch problems early. It is easier to be treated for problems in the early stages.  WHAT TO EXPECT AT A DENTIST VISIT  Your dentist will look for many common oral health problems and recommend proper treatment. At your regular dental visit, you can expect:  Gentle cleaning of the teeth and gums. This includes scraping and polishing. This helps to remove the sticky substance around the teeth and gums (plaque). Plaque forms in the mouth shortly after eating. Over time, plaque hardens on the teeth as tartar. If tartar is not removed regularly, it can cause problems. Cleaning also helps remove stains.  Periodic X-rays. These pictures of the teeth and supporting bone will help your dentist assess the health of your teeth.  Periodic fluoride treatments. Fluoride is a natural mineral shown to help strengthen teeth. Fluoride treatmentinvolves applying  a fluoride gel or varnish to the teeth. It is most commonly done in children.  Examination of the mouth, tongue, jaws, teeth, and gums to look for any oral health problems, such as:  Cavities (dental caries). This is decay on the tooth caused by plaque, sugar, and acid in the mouth. It  is best to catch a cavity when it is small.  Inflammation of the gums caused by plaque buildup (gingivitis).  Problems with the mouth or malformed or misaligned teeth.  Oral cancer or other diseases of the soft tissues or jaws. KEEP YOUR TEETH AND GUMS HEALTHY For healthy teeth and gums, follow these general guidelines as well as your dentist's specific advice:  Have your teeth professionally cleaned at the dentist every 6 months.  Brush twice daily with a fluoride toothpaste.  Floss your teeth daily.  Ask your dentist if you need fluoride supplements, treatments, or fluoride toothpaste.  Eat a healthy diet. Reduce foods and drinks with added sugar.  Avoid smoking. TREATMENT FOR ORAL HEALTH PROBLEMS If you have oral health problems, treatment varies depending on the conditions present in your teeth and gums.  Your caregiver will most likely recommend good oral hygiene at each visit.  For cavities, gingivitis, or other oral health disease, your caregiver will perform a procedure to treat the problem. This is typically done at a separate appointment. Sometimes your caregiver will refer you to another dental specialist for specific tooth problems or for surgery. SEEK IMMEDIATE DENTAL CARE IF:  You have pain, bleeding, or soreness in the gum, tooth, jaw, or mouth area.  A permanent tooth becomes loose or separated from the gum socket.  You experience a blow or injury to the mouth or jaw area. Document Released: 08/01/2011 Document Revised: 02/11/2012 Document Reviewed: 08/01/2011 [facility] Patient Information 2015 [place], [place]. This information is not intended to replace advice given to you by your health care provider. Make sure you discuss any questions you have with your health care provider.  Sty A sty (hordeolum) is an infection of a gland in the eyelid located at the base of the eyelash. A sty may develop a white or yellow head of pus. It can be puffy (swollen). Usually,  the sty will burst and pus will come out on its own. They do not leave lumps in the eyelid once they drain. A sty is often confused with another form of cyst of the eyelid called a chalazion. Chalazions occur within the eyelid and not on the edge where the bases of the eyelashes are. They often are red, sore and then form firm lumps in the eyelid. CAUSES   Germs (bacteria).  Lasting (chronic) eyelid inflammation. SYMPTOMS   Tenderness, redness and swelling along the edge of the eyelid at the base of the eyelashes.  Sometimes, there is a white or yellow head of pus. It may or may not drain. DIAGNOSIS  An ophthalmologist will be able to distinguish between a sty and a chalazion and treat the condition appropriately.  TREATMENT   Styes are typically treated with warm packs (compresses) until drainage occurs.  In rare cases, medicines that kill germs (antibiotics) may be prescribed. These antibiotics may be in the form of drops, cream or pills.  If a hard lump has formed, it is generally necessary to do a small incision and remove the hardened contents of the cyst in a minor surgical procedure done in the office.  In suspicious cases, your caregiver may send the contents of the cyst  to the lab to be certain that it is not a rare, but dangerous form of cancer of the glands of the eyelid. HOME CARE INSTRUCTIONS   Wash your hands often and dry them with a clean towel. Avoid touching your eyelid. This may spread the infection to other parts of the eye.  Apply heat to your eyelid for 10 to 20 minutes, several times a day, to ease pain and help to heal it faster.  Do not squeeze the sty. Allow it to drain on its own. Wash your eyelid carefully 3 to 4 times per day to remove any pus. SEEK IMMEDIATE MEDICAL CARE IF:   Your eye becomes painful or puffy (swollen).  Your vision changes.  Your sty does not drain by itself within 3 days.  Your sty comes back within a short period of time, even  with treatment.  You have redness (inflammation) around the eye.  You have a fever. Document Released: 08/29/2005 Document Revised: 02/11/2012 Document Reviewed: 03/05/2014 [facility] Patient Information 2015 [place], [place]. This information is not intended to replace advice given to you by your health care provider. Make sure you discuss any questions you have with your health care provider.

## 2014-07-18 NOTE — ED Notes (Signed)
Pt reports rt lower and left upper dental pain that began last Thursday, progressively worse - has been taking ibuprofen at home w/o relief. Pt denies any fever - pt also w/ rt eye lid redness and burning.

## 2014-07-18 NOTE — ED Provider Notes (Signed)
Medical screening examination/treatment/procedure(s) were performed by non-physician practitioner and as supervising physician I was immediately available for consultation/collaboration.   EKG Interpretation None       Alka Falwell, MD 07/18/14 2348 

## 2014-10-25 ENCOUNTER — Ambulatory Visit: Payer: BC Managed Care – PPO | Admitting: Family Medicine
